# Patient Record
Sex: Male | Born: 1935 | Race: Black or African American | Hispanic: No | Smoking: Never smoker
Health system: Southern US, Community
[De-identification: ages and names within clinical notes are randomized; demographics above are authoritative.]

## PROBLEM LIST (undated history)

## (undated) DIAGNOSIS — M109 Gout, unspecified: Secondary | ICD-10-CM

## (undated) DIAGNOSIS — K219 Gastro-esophageal reflux disease without esophagitis: Secondary | ICD-10-CM

## (undated) DIAGNOSIS — E119 Type 2 diabetes mellitus without complications: Secondary | ICD-10-CM

## (undated) DIAGNOSIS — E785 Hyperlipidemia, unspecified: Secondary | ICD-10-CM

## (undated) DIAGNOSIS — C61 Malignant neoplasm of prostate: Secondary | ICD-10-CM

## (undated) DIAGNOSIS — I10 Essential (primary) hypertension: Secondary | ICD-10-CM

## (undated) DIAGNOSIS — I519 Heart disease, unspecified: Secondary | ICD-10-CM

## (undated) HISTORY — PX: JOINT REPLACEMENT: SHX530

## (undated) HISTORY — DX: Essential (primary) hypertension: I10

## (undated) HISTORY — DX: Malignant neoplasm of prostate: C61

## (undated) HISTORY — DX: Heart disease, unspecified: I51.9

## (undated) HISTORY — DX: Gout, unspecified: M10.9

## (undated) HISTORY — DX: Type 2 diabetes mellitus without complications: E11.9

## (undated) HISTORY — DX: Gastro-esophageal reflux disease without esophagitis: K21.9

## (undated) HISTORY — DX: Hyperlipidemia, unspecified: E78.5

## (undated) HISTORY — PX: CARDIAC SURGERY: SHX584

---

## 2003-12-30 ENCOUNTER — Other Ambulatory Visit: Payer: Self-pay

## 2004-01-24 ENCOUNTER — Encounter: Payer: Self-pay | Admitting: Family Medicine

## 2004-02-24 ENCOUNTER — Encounter: Payer: Self-pay | Admitting: Family Medicine

## 2004-03-25 ENCOUNTER — Encounter: Payer: Self-pay | Admitting: Family Medicine

## 2006-01-30 ENCOUNTER — Ambulatory Visit: Payer: Self-pay | Admitting: Urology

## 2006-02-08 ENCOUNTER — Ambulatory Visit: Payer: Self-pay | Admitting: Radiation Oncology

## 2006-02-28 ENCOUNTER — Ambulatory Visit: Payer: Self-pay | Admitting: Radiation Oncology

## 2006-03-25 ENCOUNTER — Ambulatory Visit: Payer: Self-pay | Admitting: Radiation Oncology

## 2006-04-25 ENCOUNTER — Ambulatory Visit: Payer: Self-pay | Admitting: Radiation Oncology

## 2006-05-26 ENCOUNTER — Ambulatory Visit: Payer: Self-pay | Admitting: Radiation Oncology

## 2006-07-13 ENCOUNTER — Ambulatory Visit: Payer: Self-pay | Admitting: Radiation Oncology

## 2006-09-20 ENCOUNTER — Ambulatory Visit: Payer: Self-pay | Admitting: Family Medicine

## 2006-09-21 ENCOUNTER — Ambulatory Visit: Payer: Self-pay | Admitting: Radiation Oncology

## 2006-09-24 ENCOUNTER — Ambulatory Visit: Payer: Self-pay | Admitting: Radiation Oncology

## 2006-10-10 ENCOUNTER — Ambulatory Visit: Payer: Self-pay | Admitting: Unknown Physician Specialty

## 2006-10-24 ENCOUNTER — Ambulatory Visit: Payer: Self-pay | Admitting: Radiation Oncology

## 2007-03-26 ENCOUNTER — Ambulatory Visit: Payer: Self-pay | Admitting: Radiation Oncology

## 2007-04-26 ENCOUNTER — Ambulatory Visit: Payer: Self-pay | Admitting: Radiation Oncology

## 2007-06-03 ENCOUNTER — Emergency Department: Payer: Self-pay | Admitting: Emergency Medicine

## 2007-06-24 ENCOUNTER — Ambulatory Visit: Payer: Self-pay | Admitting: Radiation Oncology

## 2007-09-24 ENCOUNTER — Ambulatory Visit: Payer: Self-pay | Admitting: Radiation Oncology

## 2008-05-26 ENCOUNTER — Ambulatory Visit: Payer: Self-pay | Admitting: Radiation Oncology

## 2008-06-19 ENCOUNTER — Ambulatory Visit: Payer: Self-pay | Admitting: Radiation Oncology

## 2008-06-23 ENCOUNTER — Ambulatory Visit: Payer: Self-pay | Admitting: Radiation Oncology

## 2011-05-30 ENCOUNTER — Ambulatory Visit: Payer: Self-pay | Admitting: Gastroenterology

## 2012-02-08 DIAGNOSIS — N529 Male erectile dysfunction, unspecified: Secondary | ICD-10-CM | POA: Insufficient documentation

## 2012-02-08 DIAGNOSIS — C61 Malignant neoplasm of prostate: Secondary | ICD-10-CM | POA: Insufficient documentation

## 2012-03-13 ENCOUNTER — Ambulatory Visit: Payer: Self-pay | Admitting: Urology

## 2012-10-03 DIAGNOSIS — N3941 Urge incontinence: Secondary | ICD-10-CM | POA: Insufficient documentation

## 2012-10-03 DIAGNOSIS — R350 Frequency of micturition: Secondary | ICD-10-CM | POA: Insufficient documentation

## 2013-09-19 DIAGNOSIS — K219 Gastro-esophageal reflux disease without esophagitis: Secondary | ICD-10-CM | POA: Insufficient documentation

## 2013-09-19 DIAGNOSIS — E785 Hyperlipidemia, unspecified: Secondary | ICD-10-CM | POA: Insufficient documentation

## 2013-09-19 DIAGNOSIS — I739 Peripheral vascular disease, unspecified: Secondary | ICD-10-CM | POA: Insufficient documentation

## 2013-09-19 DIAGNOSIS — I428 Other cardiomyopathies: Secondary | ICD-10-CM | POA: Insufficient documentation

## 2013-09-19 DIAGNOSIS — I25810 Atherosclerosis of coronary artery bypass graft(s) without angina pectoris: Secondary | ICD-10-CM | POA: Insufficient documentation

## 2013-09-19 DIAGNOSIS — I429 Cardiomyopathy, unspecified: Secondary | ICD-10-CM | POA: Insufficient documentation

## 2013-10-08 ENCOUNTER — Emergency Department: Payer: Self-pay | Admitting: Emergency Medicine

## 2013-10-15 ENCOUNTER — Ambulatory Visit: Payer: Self-pay | Admitting: Emergency Medicine

## 2013-10-22 ENCOUNTER — Encounter: Payer: Self-pay | Admitting: Emergency Medicine

## 2013-10-23 ENCOUNTER — Encounter: Payer: Self-pay | Admitting: Emergency Medicine

## 2013-11-04 DIAGNOSIS — I10 Essential (primary) hypertension: Secondary | ICD-10-CM | POA: Insufficient documentation

## 2013-11-04 DIAGNOSIS — E119 Type 2 diabetes mellitus without complications: Secondary | ICD-10-CM | POA: Insufficient documentation

## 2013-11-23 ENCOUNTER — Encounter: Payer: Self-pay | Admitting: Emergency Medicine

## 2014-04-30 DIAGNOSIS — E119 Type 2 diabetes mellitus without complications: Secondary | ICD-10-CM | POA: Diagnosis not present

## 2014-04-30 DIAGNOSIS — M79671 Pain in right foot: Secondary | ICD-10-CM | POA: Diagnosis not present

## 2014-04-30 DIAGNOSIS — B351 Tinea unguium: Secondary | ICD-10-CM | POA: Diagnosis not present

## 2014-04-30 DIAGNOSIS — M79672 Pain in left foot: Secondary | ICD-10-CM | POA: Diagnosis not present

## 2014-08-27 DIAGNOSIS — H5210 Myopia, unspecified eye: Secondary | ICD-10-CM | POA: Diagnosis not present

## 2014-08-27 DIAGNOSIS — E11319 Type 2 diabetes mellitus with unspecified diabetic retinopathy without macular edema: Secondary | ICD-10-CM | POA: Diagnosis not present

## 2014-08-27 DIAGNOSIS — H524 Presbyopia: Secondary | ICD-10-CM | POA: Diagnosis not present

## 2014-09-25 DIAGNOSIS — R6889 Other general symptoms and signs: Secondary | ICD-10-CM | POA: Diagnosis not present

## 2014-10-23 DIAGNOSIS — K219 Gastro-esophageal reflux disease without esophagitis: Secondary | ICD-10-CM | POA: Diagnosis not present

## 2014-10-23 DIAGNOSIS — C61 Malignant neoplasm of prostate: Secondary | ICD-10-CM | POA: Diagnosis not present

## 2014-10-23 DIAGNOSIS — E119 Type 2 diabetes mellitus without complications: Secondary | ICD-10-CM | POA: Diagnosis not present

## 2014-10-23 DIAGNOSIS — I25810 Atherosclerosis of coronary artery bypass graft(s) without angina pectoris: Secondary | ICD-10-CM | POA: Diagnosis not present

## 2014-10-23 DIAGNOSIS — E780 Pure hypercholesterolemia: Secondary | ICD-10-CM | POA: Diagnosis not present

## 2014-11-21 ENCOUNTER — Encounter: Payer: Self-pay | Admitting: Urology

## 2014-11-21 ENCOUNTER — Ambulatory Visit: Payer: Self-pay | Admitting: Urology

## 2014-11-21 ENCOUNTER — Ambulatory Visit (INDEPENDENT_AMBULATORY_CARE_PROVIDER_SITE_OTHER): Payer: Commercial Managed Care - HMO | Admitting: Urology

## 2014-11-21 VITALS — BP 134/74 | HR 64 | Ht 68.0 in | Wt 179.2 lb

## 2014-11-21 DIAGNOSIS — C61 Malignant neoplasm of prostate: Secondary | ICD-10-CM

## 2014-11-21 DIAGNOSIS — N3941 Urge incontinence: Secondary | ICD-10-CM

## 2014-11-21 DIAGNOSIS — I10 Essential (primary) hypertension: Secondary | ICD-10-CM | POA: Insufficient documentation

## 2014-11-21 LAB — URINALYSIS, COMPLETE
BILIRUBIN UA: NEGATIVE
GLUCOSE, UA: NEGATIVE
KETONES UA: NEGATIVE
LEUKOCYTES UA: NEGATIVE
Nitrite, UA: NEGATIVE
PH UA: 5.5 (ref 5.0–7.5)
RBC, UA: NEGATIVE
Specific Gravity, UA: >1.03 — ABNORMAL HIGH (ref 1.005–1.030)
UUROB: 0.2 mg/dL (ref 0.2–1.0)

## 2014-11-21 LAB — MICROSCOPIC EXAMINATION
Bacteria, UA: NONE SEEN
RBC, UA: NONE SEEN /hpf (ref 0–?)
RENAL EPITHEL UA: NONE SEEN /HPF

## 2014-11-21 LAB — BLADDER SCAN AMB NON-IMAGING: Scan Result: 52

## 2014-11-21 NOTE — Progress Notes (Signed)
11/21/2014 10:03 AM   Justin Greene 10-Jun-1934 [id number]  Referring provider: Sherrin Daisy, MD [place], [place] [address]  Chief Complaint  Patient presents with  . Prostate Cancer    establishing care, history of prostate cancer     HPI: 80 yo M who presents today to establisollow-up of prostate cancer.  He is a former patient of [facility] urology would prefer to transfer his care here to [place].   He was diagnosed with clinical stage T1c, Gleason 3+4 adenocarcinoma the prostate in September 2007 and completed IMRT in 2008. He does not recall ever receiving ADT.  PSA last year was stable at 0.9 (10/2013) up slightly from the previous year 0.7. He has no bothersome lower urinary tract symptoms.  He has rare episodes of urgency with urge incontinence which is somewhat bothersome.  He only gets up once to urinate.  His urinary stream is excellent and does feel as though he is able to empty his bladder.  No history of UTIs.  He denies dysuria or gross hematuria. No flank pain. No history of nephrolithiasis.  He does complain today of left inner tight burning.   PMH: Past Medical History  Diagnosis Date  . Prostate cancer   . Diabetes mellitus without complication   . Hypertension   . Acid reflux   . Heart disease   . Gout   . Heart disease   . Hyperlipidemia   . Hypertension     Surgical History: Past Surgical History  Procedure Laterality Date  . Cardiac surgery      Home Medications:    Medication List       This list is accurate as of: 11/21/14 11:59 PM.  Always use your most recent med list.               acetaminophen 325 MG tablet  Commonly known as:  TYLENOL  Take 650 mg by mouth.     amLODipine 5 MG tablet  Commonly known as:  NORVASC  Take 5 mg by mouth.     aspirin EC 81 MG tablet  Take 81 mg by mouth.     atenolol 25 MG tablet  Commonly known as:  TENORMIN  Take 25 mg by mouth.     BESIVANCE 0.6 % Susp  Generic  drug:  Besifloxacin HCl     cyclobenzaprine 10 MG tablet  Commonly known as:  FLEXERIL     DUREZOL 0.05 % Emul  Generic drug:  Difluprednate     glimepiride 2 MG tablet  Commonly known as:  AMARYL  Take 2 mg by mouth.     ibuprofen 800 MG tablet  Commonly known as:  ADVIL,MOTRIN     LEVITRA 10 MG tablet  Generic drug:  vardenafil  Take 10 mg by mouth.     LIPITOR 40 MG tablet  Generic drug:  atorvastatin  Take 40 mg by mouth.     meloxicam 15 MG tablet  Commonly known as:  MOBIC  TK 1 T PO QD     metFORMIN 500 MG tablet  Commonly known as:  GLUCOPHAGE     pantoprazole 40 MG tablet  Commonly known as:  PROTONIX     PLAVIX 75 MG tablet  Generic drug:  clopidogrel  Take 75 mg by mouth.     rOPINIRole 0.25 MG tablet  Commonly known as:  REQUIP  Take 0.25 mg by mouth.     triamcinolone 0.025 % cream  Commonly known  as:  KENALOG  APP EXT AA BID        Allergies:  Allergies  Allergen Reactions  . Sulfa Antibiotics Rash    Family History: Family History  Problem Relation Age of Onset  . Kidney failure Sister   . Prostate cancer Father   . Diabetes Sister   . Ulcers Father   . Ulcers Sister     Social History:  reports that he has never smoked. He does not have any smokeless tobacco history on file. He reports that he does not drink alcohol or use illicit drugs.  ROS: UROLOGY Frequent Urination?: No Hard to postpone urination?: No Burning/pain with urination?: No Get up at night to urinate?: Yes Leakage of urine?: Yes Urine stream starts and stops?: No Trouble starting stream?: No Do you have to strain to urinate?: No Blood in urine?: No Urinary tract infection?: No Sexually transmitted disease?: No Injury to kidneys or bladder?: No Painful intercourse?: No Weak stream?: No Erection problems?: No Penile pain?: No  Gastrointestinal Nausea?: No Vomiting?: No Indigestion/heartburn?: No Diarrhea?: No Constipation?:  No  Constitutional Fever: No Night sweats?: No Weight loss?: No Fatigue?: No  Skin Skin rash/lesions?: No Itching?: No  Eyes Blurred vision?: No Double vision?: No  Ears/Nose/Throat Sore throat?: Yes Sinus problems?: Yes  Hematologic/Lymphatic Swollen glands?: No Easy bruising?: No  Cardiovascular Leg swelling?: Yes Chest pain?: No  Respiratory Cough?: No Shortness of breath?: No  Endocrine Excessive thirst?: Yes  Musculoskeletal Back pain?: Yes Joint pain?: Yes  Neurological Headaches?: Yes Dizziness?: No  Psychologic Depression?: No Anxiety?: No  Physical Exam: BP 134/74 mmHg  Pulse 64  Ht 5' 8"  (1.727 m)  Wt 179 lb 3.2 oz (81.285 kg)  BMI 27.25 kg/m2  Constitutional:  Alert and oriented, No acute distress. HEENT: [place] AT, moist mucus membranes.  Trachea midline, no masses. Cardiovascular: No clubbing, cyanosis, or edema. Respiratory: Normal respiratory effort, no increased work of breathing. GI: Abdomen is soft, nontender, nondistended, no abdominal masses GU: No CVA tenderness. Uncircumcised phallus with easily retractible foreskin.  Normal scrotum with bilateral descended testicles, no masses, nontender. Rectal exam today with normal sphincter tone, 30+ gram prostate, diffusely firm, no nodules, nontender. Skin: No rashes, bruises or suspicious lesions. Lymph: No cervical or inguinal adenopathy. Neurologic: Grossly intact, no focal deficits, moving all 4 extremities. Psychiatric: Normal mood and affect.  Laboratory Data: Hemoglobin A1C - Final result (10/23/2014 9:01 AM) Hemoglobin A1C - Final result (10/23/2014 9:01 AM)  Component Value Range  Hemoglobin A1C 6.8 (H) 4.2-5.6 %    Basic Metabolic Panel (BMP) - Final result (10/23/2014 9:01 AM) Basic Metabolic Panel (BMP) - Final result (10/23/2014 9:01 AM)  Component Value Range  Glucose 150 (H) 70-110 mg/dL  Sodium 138 136-145 mmol/L  Potassium 4.4 3.6-5.1 mmol/L  Chloride 103 97-109  mmol/L  Carbon Dioxide (CO2) 32.0 22.0-32.0 mmol/L  Calcium 9.4 8.7-10.3 mg/dL  Urea Nitrogen (BUN) 14 7-25 mg/dL  Creatinine 1.0 0.7-1.3 mg/dL  Glomerular Filtration Rate (eGFR), MDRD Estimate 88 >60 mL/min/1.73sq m  BUN/Crea Ratio 14.0 6.0-20.0  Anion Gap w/K 7.4 6.0-16.0      Urinalysis Results for orders placed or performed in visit on 11/21/14  Microscopic Examination  Result Value Ref Range   WBC, UA 0-5 0 -  5 /hpf   RBC, UA None seen 0 -  2 /hpf   Epithelial Cells (non renal) 0-10 0 - 10 /hpf   Renal Epithel, UA None seen None seen /hpf   Mucus, UA  Present (A) Not Estab.   Bacteria, UA None seen None seen/Few  PSA  Result Value Ref Range   Prostate Specific Ag, Serum 1.4 0.0 - 4.0 ng/mL  Urinalysis, Complete  Result Value Ref Range   Specific Gravity, UA >1.030 (H) 1.005 - 1.030   pH, UA 5.5 5.0 - 7.5   Color, UA Yellow Yellow   Appearance Ur Clear Clear   Leukocytes, UA Negative Negative   Protein, UA 1+ (A) Negative/Trace   Glucose, UA Negative Negative   Ketones, UA Negative Negative   RBC, UA Negative Negative   Bilirubin, UA Negative Negative   Urobilinogen, Ur 0.2 0.2 - 1.0 mg/dL   Nitrite, UA Negative Negative   Microscopic Examination See below:   Bladder Scan (Post Void Residual) in office  Result Value Ref Range   Scan Result 52     Pertinent Imaging: n/a  Assessment & Plan:  80 year old male with remote history of Gleason 3+4 prostate cancer status post EBRT in 2008.  He also has episodes of urge incontinence.  1. Prostate cancer PSA up slightly today to 1.4 from 0.9 one year ago. Generally, would recommend annual PSA, however, given recent PSA acceleration, will recheck in 6 months. - PSA  2. Urge incontinence of urine We discussed his symptoms today in detail. Behavioral modification and avoidance of irritating beverages were discussed. We also discussed potential use of an anticholinergic medication, however, after discussing possible side  effects, he would prefer to defer any other additional medications. He is not significantly bothered by this.  No evidence of urinary retention or infection. - Urinalysis, Complete - Bladder Scan (Post Void Residual) in office   Return in about 6 months (around 05/24/2015) for PSA.  [name], MD  [facility] [address] [place], [place] [address] [phone]

## 2014-11-22 LAB — PSA: Prostate Specific Ag, Serum: 1.4 ng/mL (ref 0.0–4.0)

## 2014-11-23 ENCOUNTER — Encounter: Payer: Self-pay | Admitting: Urology

## 2014-11-24 ENCOUNTER — Telehealth: Payer: Self-pay | Admitting: *Deleted

## 2014-11-25 NOTE — Telephone Encounter (Signed)
Opened in error. . . sm

## 2015-04-16 DIAGNOSIS — R0982 Postnasal drip: Secondary | ICD-10-CM | POA: Diagnosis not present

## 2015-04-29 DIAGNOSIS — E1142 Type 2 diabetes mellitus with diabetic polyneuropathy: Secondary | ICD-10-CM | POA: Diagnosis not present

## 2015-04-29 DIAGNOSIS — K219 Gastro-esophageal reflux disease without esophagitis: Secondary | ICD-10-CM | POA: Diagnosis not present

## 2015-04-29 DIAGNOSIS — I10 Essential (primary) hypertension: Secondary | ICD-10-CM | POA: Diagnosis not present

## 2015-04-29 DIAGNOSIS — E7800 Pure hypercholesterolemia, unspecified: Secondary | ICD-10-CM | POA: Diagnosis not present

## 2015-06-03 ENCOUNTER — Other Ambulatory Visit: Payer: Self-pay | Admitting: *Deleted

## 2015-06-03 NOTE — Patient Outreach (Signed)
[facility]) Care Management  06/03/2015  Justin Greene 27-Apr-1934 [id number]  Subjective:  Telephone call from patient, HIPAA verified.   Patient states he is returning a call from [facility] phone number that he had received on his mobile phone.   RNCM reviewed Epic and no documentation of any attempt calls from [facility].   RNCM advised patient RNCM would research and someone would call patient back if needed for patient outreach.  RNCM spoke with Damita Rhodie at [facility].   States patient is eligible for program due to payer status, no calls,  or mailings have went out to patient from tier list referral process.  Objective: Epic case review.  No [facility] Care Management referral triggers noted.  Plan:  No [facility] Care Management services needs noted at this time.    Lorry Anastasi H. Annia Friendly, BSN, [place] [facility] Telephonic CM Phone: [phone] Fax: [phone]

## 2015-06-26 DIAGNOSIS — M1611 Unilateral primary osteoarthritis, right hip: Secondary | ICD-10-CM | POA: Diagnosis not present

## 2015-07-01 DIAGNOSIS — M1611 Unilateral primary osteoarthritis, right hip: Secondary | ICD-10-CM | POA: Insufficient documentation

## 2015-09-03 DIAGNOSIS — E113393 Type 2 diabetes mellitus with moderate nonproliferative diabetic retinopathy without macular edema, bilateral: Secondary | ICD-10-CM | POA: Diagnosis not present

## 2015-09-03 DIAGNOSIS — H524 Presbyopia: Secondary | ICD-10-CM | POA: Diagnosis not present

## 2015-09-03 DIAGNOSIS — H5210 Myopia, unspecified eye: Secondary | ICD-10-CM | POA: Diagnosis not present

## 2015-11-02 DIAGNOSIS — E119 Type 2 diabetes mellitus without complications: Secondary | ICD-10-CM | POA: Diagnosis not present

## 2015-11-02 DIAGNOSIS — I10 Essential (primary) hypertension: Secondary | ICD-10-CM | POA: Diagnosis not present

## 2015-11-02 DIAGNOSIS — E7800 Pure hypercholesterolemia, unspecified: Secondary | ICD-10-CM | POA: Diagnosis not present

## 2015-11-02 DIAGNOSIS — M79605 Pain in left leg: Secondary | ICD-10-CM | POA: Diagnosis not present

## 2015-11-18 DIAGNOSIS — M4806 Spinal stenosis, lumbar region: Secondary | ICD-10-CM | POA: Diagnosis not present

## 2015-11-18 DIAGNOSIS — M5416 Radiculopathy, lumbar region: Secondary | ICD-10-CM | POA: Diagnosis not present

## 2015-11-18 DIAGNOSIS — M1611 Unilateral primary osteoarthritis, right hip: Secondary | ICD-10-CM | POA: Diagnosis not present

## 2015-11-20 ENCOUNTER — Ambulatory Visit (INDEPENDENT_AMBULATORY_CARE_PROVIDER_SITE_OTHER): Payer: Commercial Managed Care - HMO | Admitting: Urology

## 2015-11-20 ENCOUNTER — Encounter: Payer: Self-pay | Admitting: Urology

## 2015-11-20 VITALS — BP 145/75 | HR 66 | Ht 68.0 in | Wt 175.3 lb

## 2015-11-20 DIAGNOSIS — N3941 Urge incontinence: Secondary | ICD-10-CM | POA: Diagnosis not present

## 2015-11-20 DIAGNOSIS — C61 Malignant neoplasm of prostate: Secondary | ICD-10-CM

## 2015-11-20 NOTE — Progress Notes (Signed)
11/20/2015 9:21 AM   [name] 07-06-1934 [id number]  Referring provider: Sherrin Daisy, MD [place], [place] [id number]  Chief Complaint  Patient presents with  . Follow-up    HPI: 81 yo M wit history of prostate cancer who returns for annual follow up.     He was diagnosed with clinical stage T1c, Gleason 3+4 adenocarcinoma the prostate in September 2007 and completed IMRT in 2008. He does not recall ever receiving ADT.  Recent PSA trend: 0.7  2014 0.9  10/2013 1.4  10/2014  His urinary symptoms are stable.  He continues to have rare episodes of urgency with urge incontinence which is somewhat bothersome.  He only gets up once to urinate.  His urinary stream is excellent and does feel as though he is able to empty his bladder.  No history of UTIs.   He does complain today of pains in his lower back has been going on for a while.  He is also having pain in his leg which his primary care believes is related to his hip.    PMH: Past Medical History:  Diagnosis Date  . Acid reflux   . Diabetes mellitus without complication ([place])   . Gout   . Heart disease   . Heart disease   . Hyperlipidemia   . Hypertension   . Hypertension   . Prostate cancer [facility])     Surgical History: Past Surgical History:  Procedure Laterality Date  . CARDIAC SURGERY      Home Medications:    Medication List       Accurate as of 11/20/15  9:21 AM. Always use your most recent med list.          acetaminophen 325 MG tablet Commonly known as:  TYLENOL Take 650 mg by mouth.   amLODipine 5 MG tablet Commonly known as:  NORVASC Take 5 mg by mouth.   aspirin EC 81 MG tablet Take 81 mg by mouth.   atenolol 25 MG tablet Commonly known as:  TENORMIN Take 25 mg by mouth.   BESIVANCE 0.6 % Susp Generic drug:  Besifloxacin HCl   cyclobenzaprine 10 MG tablet Commonly known as:  FLEXERIL   DUREZOL 0.05 % Emul Generic drug:  Difluprednate   glimepiride 2 MG  tablet Commonly known as:  AMARYL Take 2 mg by mouth.   ibuprofen 800 MG tablet Commonly known as:  ADVIL,MOTRIN   LEVITRA 10 MG tablet Generic drug:  vardenafil Take 10 mg by mouth.   LIPITOR 40 MG tablet Generic drug:  atorvastatin Take 40 mg by mouth.   meloxicam 15 MG tablet Commonly known as:  MOBIC TK 1 T PO QD   metFORMIN 500 MG tablet Commonly known as:  GLUCOPHAGE   pantoprazole 40 MG tablet Commonly known as:  PROTONIX   PLAVIX 75 MG tablet Generic drug:  clopidogrel Take 75 mg by mouth.   rOPINIRole 0.25 MG tablet Commonly known as:  REQUIP Take 0.25 mg by mouth.   triamcinolone 0.025 % cream Commonly known as:  KENALOG APP EXT AA BID       Allergies:  Allergies  Allergen Reactions  . Sulfa Antibiotics Rash    Family History: Family History  Problem Relation Age of Onset  . Kidney failure Sister   . Prostate cancer Father   . Diabetes Sister   . Ulcers Father   . Ulcers Sister     Social History:  reports that he has never smoked.  He has never used smokeless tobacco. He reports that he does not drink alcohol or use drugs.  ROS: UROLOGY Frequent Urination?: No Hard to postpone urination?: No Burning/pain with urination?: No Get up at night to urinate?: Yes Leakage of urine?: Yes Urine stream starts and stops?: No Trouble starting stream?: No Do you have to strain to urinate?: No Blood in urine?: No Urinary tract infection?: No Sexually transmitted disease?: No Injury to kidneys or bladder?: No Painful intercourse?: No Weak stream?: No Erection problems?: No Penile pain?: No  Gastrointestinal Nausea?: No Vomiting?: No Indigestion/heartburn?: No Diarrhea?: No Constipation?: No  Constitutional Fever: No Night sweats?: No Weight loss?: No Fatigue?: No  Skin Skin rash/lesions?: No Itching?: No  Eyes Blurred vision?: No Double vision?: No  Ears/Nose/Throat Sore throat?: No Sinus problems?:  No  Hematologic/Lymphatic Swollen glands?: No Easy bruising?: No  Cardiovascular Leg swelling?: No Chest pain?: No  Respiratory Cough?: No Shortness of breath?: No  Endocrine Excessive thirst?: No  Musculoskeletal Back pain?: Yes Joint pain?: Yes  Neurological Headaches?: No Dizziness?: No  Psychologic Depression?: No Anxiety?: No  Physical Exam: BP (!) 145/75 (BP Location: Left Arm, Patient Position: Sitting, Cuff Size: Normal)   Pulse 66   Ht 5\' 8"  (1.727 m)   Wt 175 lb 4.8 oz (79.5 kg)   BMI 26.65 kg/m   Constitutional:  Alert and oriented, No acute distress. HEENT: [place] AT, moist mucus membranes.  Trachea midline, no masses. Cardiovascular: No clubbing, cyanosis, or edema. Respiratory: Normal respiratory effort, no increased work of breathing. GI: Abdomen is soft, nontender, nondistended, no abdominal masses GU: No CVA tenderness. Uncircumcised phallus with easily retractible foreskin.  Normal scrotum with bilateral descended testicles, no masses, nontender. Rectal exam today with normal sphincter tone, 30+ gram prostate, diffusely firm, no nodules, nontender. Skin: No rashes, bruises or suspicious lesions. Lymph: No cervical or inguinal adenopathy. Neurologic: Grossly intact, no focal deficits, moving all 4 extremities. Psychiatric: Normal mood and affect.  Laboratory Data: CREATININE 1.0 04/29/2015 Hemoglobin A1C 6.7 (H) 4.2 - 5.6 %   PSA trend as above  Pertinent Imaging: n/a  Assessment & Plan:  81 year old male with remote history of Gleason 3+4 prostate cancer status post EBRT in 2008.  He also has episodes of urge incontinence.  1. Prostate cancer PSA rising from 1.4 from 0.9 last year.  PSA repeated today.   If stable, will continue to follow annually If rising, may consider restaging imaging due to concern for biochemical recurrence  - PSA  2. Urge incontinence of urine Minimal bother.  Not interested in   Return in about 1 year (around  11/19/2016) for PSA/ DRE.  [name], MD  [facility] [address] [place],  [address] [phone]

## 2015-11-21 LAB — PSA: Prostate Specific Ag, Serum: 1.5 ng/mL (ref 0.0–4.0)

## 2015-11-23 ENCOUNTER — Telehealth: Payer: Self-pay | Admitting: *Deleted

## 2015-11-23 NOTE — Telephone Encounter (Signed)
LMOM for patient to call office back. PSA stable from last year. We will continue to follow up as scheduled.

## 2015-11-23 NOTE — Telephone Encounter (Signed)
-----   Message from [name], MD sent at 11/22/2015  1:37 PM EDT ----- PSA stable from last year.  We will continue to follow as scheduled.    [name], MD

## 2015-11-24 NOTE — Telephone Encounter (Signed)
LMOM to RC 

## 2015-11-25 ENCOUNTER — Telehealth: Payer: Self-pay | Admitting: Urology

## 2015-11-25 NOTE — Telephone Encounter (Signed)
LMOM that labs are stable from last year and we will continue to follow as scheduled if any questions call office back.

## 2015-11-25 NOTE — Telephone Encounter (Signed)
Spoke with patient and gave him the results, he had no questions   michelle

## 2015-11-25 NOTE — Telephone Encounter (Signed)
LMOM for patient to call office back. 

## 2015-12-01 DIAGNOSIS — M1611 Unilateral primary osteoarthritis, right hip: Secondary | ICD-10-CM | POA: Diagnosis not present

## 2016-02-23 ENCOUNTER — Other Ambulatory Visit: Payer: Self-pay | Admitting: Physical Medicine and Rehabilitation

## 2016-02-23 DIAGNOSIS — M5416 Radiculopathy, lumbar region: Secondary | ICD-10-CM | POA: Diagnosis not present

## 2016-02-23 DIAGNOSIS — M1611 Unilateral primary osteoarthritis, right hip: Secondary | ICD-10-CM | POA: Diagnosis not present

## 2016-02-23 DIAGNOSIS — M48062 Spinal stenosis, lumbar region with neurogenic claudication: Secondary | ICD-10-CM | POA: Diagnosis not present

## 2016-03-07 DIAGNOSIS — M1611 Unilateral primary osteoarthritis, right hip: Secondary | ICD-10-CM | POA: Diagnosis not present

## 2016-03-09 ENCOUNTER — Encounter (INDEPENDENT_AMBULATORY_CARE_PROVIDER_SITE_OTHER): Payer: Self-pay

## 2016-03-09 ENCOUNTER — Ambulatory Visit
Admission: RE | Admit: 2016-03-09 | Discharge: 2016-03-09 | Disposition: A | Discharge status: Home / Self Care | Payer: Commercial Managed Care - HMO | Source: Ambulatory Visit | Attending: Physical Medicine and Rehabilitation | Admitting: Physical Medicine and Rehabilitation

## 2016-03-09 DIAGNOSIS — M48061 Spinal stenosis, lumbar region without neurogenic claudication: Secondary | ICD-10-CM | POA: Diagnosis not present

## 2016-03-09 DIAGNOSIS — M545 Low back pain: Secondary | ICD-10-CM | POA: Diagnosis not present

## 2016-03-09 DIAGNOSIS — M5416 Radiculopathy, lumbar region: Secondary | ICD-10-CM | POA: Diagnosis not present

## 2016-03-22 DIAGNOSIS — E119 Type 2 diabetes mellitus without complications: Secondary | ICD-10-CM | POA: Diagnosis not present

## 2016-03-31 ENCOUNTER — Other Ambulatory Visit (INDEPENDENT_AMBULATORY_CARE_PROVIDER_SITE_OTHER): Payer: Self-pay | Admitting: Vascular Surgery

## 2016-04-04 ENCOUNTER — Other Ambulatory Visit (INDEPENDENT_AMBULATORY_CARE_PROVIDER_SITE_OTHER): Payer: Self-pay | Admitting: Vascular Surgery

## 2016-04-04 DIAGNOSIS — M48062 Spinal stenosis, lumbar region with neurogenic claudication: Secondary | ICD-10-CM | POA: Diagnosis not present

## 2016-04-04 DIAGNOSIS — M1611 Unilateral primary osteoarthritis, right hip: Secondary | ICD-10-CM | POA: Diagnosis not present

## 2016-04-04 DIAGNOSIS — M5416 Radiculopathy, lumbar region: Secondary | ICD-10-CM | POA: Diagnosis not present

## 2016-04-28 DIAGNOSIS — K006 Disturbances in tooth eruption: Secondary | ICD-10-CM | POA: Diagnosis not present

## 2016-05-05 DIAGNOSIS — I739 Peripheral vascular disease, unspecified: Secondary | ICD-10-CM | POA: Diagnosis not present

## 2016-05-05 DIAGNOSIS — B351 Tinea unguium: Secondary | ICD-10-CM | POA: Diagnosis not present

## 2016-05-05 DIAGNOSIS — E1142 Type 2 diabetes mellitus with diabetic polyneuropathy: Secondary | ICD-10-CM | POA: Diagnosis not present

## 2016-05-05 DIAGNOSIS — I10 Essential (primary) hypertension: Secondary | ICD-10-CM | POA: Diagnosis not present

## 2016-05-05 DIAGNOSIS — J209 Acute bronchitis, unspecified: Secondary | ICD-10-CM | POA: Diagnosis not present

## 2016-05-05 DIAGNOSIS — E1169 Type 2 diabetes mellitus with other specified complication: Secondary | ICD-10-CM | POA: Diagnosis not present

## 2016-05-05 DIAGNOSIS — I429 Cardiomyopathy, unspecified: Secondary | ICD-10-CM | POA: Diagnosis not present

## 2016-05-05 DIAGNOSIS — E7800 Pure hypercholesterolemia, unspecified: Secondary | ICD-10-CM | POA: Diagnosis not present

## 2016-05-09 DIAGNOSIS — I739 Peripheral vascular disease, unspecified: Secondary | ICD-10-CM | POA: Diagnosis not present

## 2016-05-09 DIAGNOSIS — L300 Nummular dermatitis: Secondary | ICD-10-CM | POA: Diagnosis not present

## 2016-08-01 ENCOUNTER — Ambulatory Visit
Admission: EM | Admit: 2016-08-01 | Discharge: 2016-08-01 | Disposition: A | Discharge status: Home / Self Care | Payer: Medicare HMO | Attending: Family Medicine | Admitting: Family Medicine

## 2016-08-01 ENCOUNTER — Encounter: Payer: Self-pay | Admitting: Emergency Medicine

## 2016-08-01 DIAGNOSIS — F5101 Primary insomnia: Secondary | ICD-10-CM

## 2016-08-01 MED ORDER — ZOLPIDEM TARTRATE 5 MG PO TABS: 5.0000 mg | ORAL_TABLET | Freq: Every evening | ORAL | 0 refills | Status: DC | PRN

## 2016-08-01 NOTE — ED Provider Notes (Signed)
[facility]    CSN: [id number] Arrival date & time: 08/01/16  0909     History   Chief Complaint Chief Complaint  Patient presents with  . Insomnia    HPI Justin Greene is a 82 y.o. male.   The history is provided by the patient.  Insomnia  This is a new problem. The current episode started more than 2 days ago (3 days ago). The problem occurs constantly. The problem has not changed since onset.Pertinent negatives include no chest pain, no abdominal pain, no headaches and no shortness of breath. Nothing relieves the symptoms.    Past Medical History:  Diagnosis Date  . Acid reflux   . Diabetes mellitus without complication ([place])   . Gout   . Heart disease   . Heart disease   . Hyperlipidemia   . Hypertension   . Hypertension   . Prostate cancer [facility])     Patient Active Problem List   Diagnosis Date Noted  . Primary osteoarthritis of right hip 07/01/2015  . BP (high blood pressure) 11/21/2014  . Diabetes mellitus type 2, uncomplicated ([place]) 85/46/2703  . Essential (primary) hypertension 11/04/2013  . 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 Cardiomyopathy ([place]) 09/19/2013  . Claudication ([place]) 09/19/2013  . Acid reflux 09/19/2013  . HLD (hyperlipidemia) 09/19/2013  . Peripheral vascular disease ([place]) 09/19/2013  . Cardiomyopathy, idiopathic ([place]) 09/19/2013  . Urge incontinence 10/03/2012  . FOM (frequency of micturition) 10/03/2012  . Erectile dysfunction 02/08/2012  . Prostate cancer ([place]) 02/08/2012    Past Surgical History:  Procedure Laterality Date  . CARDIAC SURGERY         Home Medications    Prior to Admission medications   Medication Sig Start Date End Date Taking? Authorizing Provider  lovastatin (MEVACOR) 20 MG tablet Take 20 mg by mouth daily at 6 PM.   Yes Historical Provider, MD  pregabalin (LYRICA) 150 MG capsule Take 150 mg by mouth 2 (two) times daily.   Yes Historical  Provider, MD  traMADol (ULTRAM) 50 MG tablet Take 75 mg by mouth every 12 (twelve) hours as needed.   Yes Historical Provider, MD  acetaminophen (TYLENOL) 325 MG tablet Take 650 mg by mouth.    Historical Provider, MD  amLODipine (NORVASC) 5 MG tablet Take 5 mg by mouth. 02/08/12   Historical Provider, MD  aspirin EC 81 MG tablet Take 81 mg by mouth. 02/08/12   Historical Provider, MD  atenolol (TENORMIN) 25 MG tablet Take 25 mg by mouth. 02/08/12   Historical Provider, MD  Besifloxacin HCl (BESIVANCE) 0.6 % SUSP  08/21/13   Historical Provider, MD  clopidogrel (PLAVIX) 75 MG tablet Take 75 mg by mouth. 02/08/12   Historical Provider, MD  Difluprednate (DUREZOL) 0.05 % EMUL  08/20/13   Historical Provider, MD  glimepiride (AMARYL) 2 MG tablet Take 2 mg by mouth. 02/08/12   Historical Provider, MD  ibuprofen (ADVIL,MOTRIN) 800 MG tablet TAKE 1 TABLET BY MOUTH EVERY 8 HOURS AS NEEDED FOR 7 DAYS 04/06/16   Algernon Huxley, MD  metFORMIN (GLUCOPHAGE) 500 MG tablet 500 mg 2 (two) times daily with a meal.  11/20/14   Historical Provider, MD  pantoprazole (PROTONIX) 40 MG tablet  07/24/12   Historical Provider, MD  triamcinolone (KENALOG) 0.025 % cream APP EXT AA BID 10/23/14   Historical Provider, MD  zolpidem (AMBIEN) 5 MG tablet Take 1 tablet (5 mg total) by mouth at bedtime as needed  for sleep. 08/01/16   Norval Gable, MD    Family History Family History  Problem Relation Age of Onset  . Kidney failure Sister   . Prostate cancer Father   . Ulcers Father   . Diabetes Sister   . Ulcers Sister     Social History Social History  Substance Use Topics  . Smoking status: Never Smoker  . Smokeless tobacco: Never Used  . Alcohol use No     Allergies   Sulfa antibiotics   Review of Systems Review of Systems  Respiratory: Negative for shortness of breath.   Cardiovascular: Negative for chest pain.  Gastrointestinal: Negative for abdominal pain.  Neurological: Negative for headaches.    Psychiatric/Behavioral: The patient has insomnia.      Physical Exam Triage Vital Signs ED Triage Vitals  Enc Vitals Group     BP 08/01/16 0931 (!) 158/74     Pulse Rate 08/01/16 0931 92     Resp 08/01/16 0931 16     Temp 08/01/16 0931 98.4 F (36.9 C)     Temp Source 08/01/16 0931 Oral     SpO2 08/01/16 0931 99 %     Weight 08/01/16 0930 175 lb (79.4 kg)     Height 08/01/16 0930 5\' 6"  (1.676 m)     Head Circumference --      Peak Flow --      Pain Score 08/01/16 0931 0     Pain Loc --      Pain Edu? --      Excl. in [place]? --    No data found.   Updated Vital Signs BP (!) 158/74 (BP Location: Left Arm)   Pulse 92   Temp 98.4 F (36.9 C) (Oral)   Resp 16   Ht 5\' 6"  (1.676 m)   Wt 175 lb (79.4 kg)   SpO2 99%   BMI 28.25 kg/m   Visual Acuity Right Eye Distance:   Left Eye Distance:   Bilateral Distance:    Right Eye Near:   Left Eye Near:    Bilateral Near:     Physical Exam  Constitutional: He is oriented to person, place, and time. He appears well-developed and well-nourished. No distress.  Cardiovascular: Normal rate, regular rhythm and normal heart sounds.   No murmur heard. Pulmonary/Chest: Effort normal and breath sounds normal. No respiratory distress. He has no wheezes. He has no rales.  Neurological: He is alert and oriented to person, place, and time. He displays normal reflexes. No cranial nerve deficit or sensory deficit. He exhibits normal muscle tone. Coordination normal.  Skin: He is not diaphoretic.  Psychiatric: He has a normal mood and affect. His behavior is normal. Judgment and thought content normal.  Nursing note and vitals reviewed.    UC Treatments / Results  Labs (all labs ordered are listed, but only abnormal results are displayed) Labs Reviewed - No data to display  EKG  EKG Interpretation None       Radiology No results found.  Procedures Procedures (including critical care time)  Medications Ordered in  UC Medications - No data to display   Initial Impression / Assessment and Plan / UC Course  I have reviewed the triage vital signs and the nursing notes.  Pertinent labs & imaging results that were available during my care of the patient were reviewed by me and considered in my medical decision making (see chart for details).       Final Clinical Impressions(s) / UC  Diagnoses   Final diagnoses:  Primary insomnia    New Prescriptions Discharge Medication List as of 08/01/2016 10:16 AM    START taking these medications   Details  zolpidem (AMBIEN) 5 MG tablet Take 1 tablet (5 mg total) by mouth at bedtime as needed for sleep., Starting Mon 08/01/2016, Print       1. diagnosis reviewed with patient 2. rx as per orders above; reviewed possible side effects, interactions, risks and benefits  3. Follow up with PCP 4.Follow-up prn    Norval Gable, MD 08/01/16 2224

## 2016-08-01 NOTE — ED Triage Notes (Signed)
Patient reports trouble sleeping for the past 3 days.  Patient reports cough for the past 3 days also.

## 2016-08-02 DIAGNOSIS — G479 Sleep disorder, unspecified: Secondary | ICD-10-CM | POA: Diagnosis not present

## 2016-08-02 DIAGNOSIS — J00 Acute nasopharyngitis [common cold]: Secondary | ICD-10-CM | POA: Diagnosis not present

## 2016-09-22 DIAGNOSIS — F1190 Opioid use, unspecified, uncomplicated: Secondary | ICD-10-CM | POA: Diagnosis not present

## 2016-09-30 DIAGNOSIS — M1611 Unilateral primary osteoarthritis, right hip: Secondary | ICD-10-CM | POA: Diagnosis not present

## 2016-11-04 DIAGNOSIS — E7800 Pure hypercholesterolemia, unspecified: Secondary | ICD-10-CM | POA: Diagnosis not present

## 2016-11-04 DIAGNOSIS — G479 Sleep disorder, unspecified: Secondary | ICD-10-CM | POA: Diagnosis not present

## 2016-11-04 DIAGNOSIS — Z79899 Other long term (current) drug therapy: Secondary | ICD-10-CM | POA: Diagnosis not present

## 2016-11-04 DIAGNOSIS — Z23 Encounter for immunization: Secondary | ICD-10-CM | POA: Diagnosis not present

## 2016-11-04 DIAGNOSIS — I10 Essential (primary) hypertension: Secondary | ICD-10-CM | POA: Diagnosis not present

## 2016-11-04 DIAGNOSIS — K219 Gastro-esophageal reflux disease without esophagitis: Secondary | ICD-10-CM | POA: Diagnosis not present

## 2016-11-04 DIAGNOSIS — E119 Type 2 diabetes mellitus without complications: Secondary | ICD-10-CM | POA: Diagnosis not present

## 2016-11-08 DIAGNOSIS — E538 Deficiency of other specified B group vitamins: Secondary | ICD-10-CM | POA: Diagnosis not present

## 2016-11-11 ENCOUNTER — Other Ambulatory Visit: Payer: Commercial Managed Care - HMO

## 2016-11-11 DIAGNOSIS — C61 Malignant neoplasm of prostate: Secondary | ICD-10-CM | POA: Diagnosis not present

## 2016-11-12 LAB — PSA: Prostate Specific Ag, Serum: 1.8 ng/mL (ref 0.0–4.0)

## 2016-11-16 DIAGNOSIS — E538 Deficiency of other specified B group vitamins: Secondary | ICD-10-CM | POA: Diagnosis not present

## 2016-11-18 ENCOUNTER — Encounter: Payer: Self-pay | Admitting: Urology

## 2016-11-18 ENCOUNTER — Ambulatory Visit (INDEPENDENT_AMBULATORY_CARE_PROVIDER_SITE_OTHER): Payer: Medicare HMO | Admitting: Urology

## 2016-11-18 VITALS — BP 118/66 | HR 73 | Ht 68.0 in | Wt 180.0 lb

## 2016-11-18 DIAGNOSIS — Z8546 Personal history of malignant neoplasm of prostate: Secondary | ICD-10-CM | POA: Diagnosis not present

## 2016-11-18 DIAGNOSIS — K5903 Drug induced constipation: Secondary | ICD-10-CM | POA: Diagnosis not present

## 2016-11-18 DIAGNOSIS — N3941 Urge incontinence: Secondary | ICD-10-CM | POA: Diagnosis not present

## 2016-11-18 MED ORDER — DOCUSATE SODIUM 100 MG PO CAPS: 100.0000 mg | ORAL_CAPSULE | Freq: Two times a day (BID) | ORAL | 6 refills | Status: DC

## 2016-11-18 NOTE — Patient Instructions (Signed)
The Male Pelvic Floor Muscles  The pelvic floor consists of several layers of muscles that cover the bottom of the pelvic cavity. These muscles have several distinct roles:  1. To support the pelvic organs, the bladder and colon within the pelvis. 2. To assist in stopping and starting the flow of urine or the passage of gas or stool. 3. To aid in sexual appreciation.    How to Locate the Pelvic Floor Muscles  The Urine Stop Test . At the midstream of your urine flow, squeeze the pelvic floor muscles. You should feel the sensation of the openings close and the muscles pulling the penis and anus up and in to the pelvic cavity.  If you have strong muscles you will slow or stop the stream of urine. . Try to stop or slow the flow of urine without tensing the muscles of your legs, buttocks. . Do this only to locate the muscles, not as a daily exercise. Feeling the Muscle . Place a fingertip on or into the rectal opening.  Contract and lift the muscles as though you were holding back gas or a bowel movement.   . You will feel your anal opening tighten and your penis move slightly. Watching the Muscles Contract . Begin by lying on a flat surface.  Position yourself with your knees apart and bent with your head elevated and supported on several pillows.  Use a mirror to look at the anal opening and penis.  . Contract or tighten the muscles around the anal opening and watch for a puckering and lifting of the anus and slight movement of the penis.   . If you see a bulge of your anus this is an incorrect contraction and you should notify your health care provider for more instructions.   2007, [employer] Doc.12 

## 2016-11-18 NOTE — Progress Notes (Signed)
11/18/2016 9:11 AM   Justin Greene 1934-07-10 [id number]  Referring provider: Sherrin Daisy, MD No address on file  Chief Complaint  Patient presents with  . Prostate Cancer    1year    HPI: 82 yo M wit history of prostate cancer who returns for annual follow up.      He was diagnosed with clinical stage T1c, Gleason 3+4 adenocarcinoma the prostate in September 2007 and completed IMRT in 2008. He does not recall ever receiving ADT.  Recent PSA trend: 0.7  2014 0.9  10/2013 1.4  10/2014 1.5  10/2015 1.8  10/2016  Today, he continues to complain of occasional episodes of what sounds like urge incontinence. He reports that he will be walking, or standing and suddenly have several tablespoons of urine drained. He has no sensation like he is going to prior to this.  He is very bothered by this. He is not currently wearing any pads. He denies any leakage with laughing, coughing, or sneezing.  Justin Greene He does not have any baseline urgency or frequency. He only gets up once at night to your void. His urinary stream is excellent and does feel as though he is able to empty his bladder.  No history of UTIs.   He does also complain of hip pain today. He reports that he needs a hip replacement but does not want to do this. He's been managed with tramadol. He does endorse constipation today.  PMH: Past Medical History:  Diagnosis Date  . Acid reflux   . Diabetes mellitus without complication ([place])   . Gout   . Heart disease   . Heart disease   . Hyperlipidemia   . Hypertension   . Hypertension   . Prostate cancer [facility])     Surgical History: Past Surgical History:  Procedure Laterality Date  . CARDIAC SURGERY      Home Medications:  Allergies as of 11/18/2016      Reactions   Sulfa Antibiotics Rash      Medication List       Accurate as of 11/18/16  9:11 AM. Always use your most recent med list.          amLODipine 5 MG tablet Commonly known as:  NORVASC Take 5  mg by mouth.   aspirin EC 81 MG tablet Take 81 mg by mouth.   atenolol 25 MG tablet Commonly known as:  TENORMIN Take 25 mg by mouth.   docusate sodium 100 MG capsule Commonly known as:  COLACE Take 1 capsule (100 mg total) by mouth 2 (two) times daily.   DUREZOL 0.05 % Emul Generic drug:  Difluprednate   glimepiride 2 MG tablet Commonly known as:  AMARYL Take 2 mg by mouth.   lovastatin 20 MG tablet Commonly known as:  MEVACOR Take 20 mg by mouth daily at 6 PM.   metFORMIN 500 MG tablet Commonly known as:  GLUCOPHAGE 500 mg 2 (two) times daily with a meal.   pantoprazole 40 MG tablet Commonly known as:  PROTONIX   PLAVIX 75 MG tablet Generic drug:  clopidogrel Take 75 mg by mouth.   pregabalin 150 MG capsule Commonly known as:  LYRICA Take 150 mg by mouth 2 (two) times daily.   traMADol 50 MG tablet Commonly known as:  ULTRAM Take 75 mg by mouth every 12 (twelve) hours as needed.   traZODone 50 MG tablet Commonly known as:  DESYREL       Allergies:  Allergies  Allergen Reactions  . Sulfa Antibiotics Rash    Family History: Family History  Problem Relation Age of Onset  . Kidney failure Sister   . Prostate cancer Father   . Ulcers Father   . Diabetes Sister   . Ulcers Sister     Social History:  reports that he has never smoked. He has never used smokeless tobacco. He reports that he does not drink alcohol or use drugs.  ROS: UROLOGY Frequent Urination?: No Hard to postpone urination?: No Burning/pain with urination?: No Get up at night to urinate?: No Leakage of urine?: Yes Urine stream starts and stops?: No Trouble starting stream?: No Do you have to strain to urinate?: No Blood in urine?: No Urinary tract infection?: No Sexually transmitted disease?: No Injury to kidneys or bladder?: No Painful intercourse?: No Weak stream?: No Erection problems?: No Penile pain?: No  Gastrointestinal Nausea?: No Vomiting?:  No Indigestion/heartburn?: No Diarrhea?: No Constipation?: No  Constitutional Fever: No Night sweats?: No Weight loss?: No Fatigue?: No  Skin Skin rash/lesions?: No Itching?: No  Eyes Blurred vision?: No Double vision?: No  Ears/Nose/Throat Sore throat?: No Sinus problems?: No  Hematologic/Lymphatic Swollen glands?: No Easy bruising?: No  Cardiovascular Leg swelling?: No Chest pain?: No  Respiratory Cough?: No Shortness of breath?: No  Endocrine Excessive thirst?: No  Musculoskeletal Back pain?: No Joint pain?: No  Neurological Headaches?: No Dizziness?: No  Psychologic Depression?: No Anxiety?: No  Physical Exam: BP 118/66   Pulse 73   Ht 5\' 8"  (1.727 m)   Wt 180 lb (81.6 kg)   BMI 27.37 kg/m   Constitutional:  Alert and oriented, No acute distress. HEENT: [place] AT, moist mucus membranes.  Trachea midline, no masses. Cardiovascular: No clubbing, cyanosis, or edema. Respiratory: Normal respiratory effort, no increased work of breathing. GI: Abdomen is soft, nontender, nondistended, no abdominal masses Rectal exam: today with normal sphincter tone, 30+ gram prostate, diffusely firm, no nodules, nontender. Skin: No rashes, bruises or suspicious lesions. Neurologic: Grossly intact, no focal deficits, moving all 4 extremities. Psychiatric: Normal mood and affect.  Laboratory Data: PSA trend as above  Pertinent Imaging: n/a  Assessment & Plan:  82 year old male with remote history of Gleason 3+4 prostate cancer status post EBRT in 2008.  He also has episodes of urge incontinence.  1. Prostate cancer PSA rising from 1.8 from 1.4 last year Will continue to follow annually- very slow doubling time - PSA  2. Urge incontinence of urine Discuss strategies for urge incontinence including behavioral modification Address constipation as below Pelvic floor exercises in case this is stress incontinence, although symptoms are more consistent with  urge Return to care to discuss pharmacotherapy if above measures fail  3. Drug induced constipation Discussed the importance of daily bowel movement and possible relationship to his urinary symptoms. I prescribed Colace twice a day.  Return in about 1 year (around 11/18/2017) for PSA.  Justin Espy, MD  [facility] [address] [place], [place] [address] [phone]

## 2016-11-23 DIAGNOSIS — E538 Deficiency of other specified B group vitamins: Secondary | ICD-10-CM | POA: Diagnosis not present

## 2016-11-30 DIAGNOSIS — E538 Deficiency of other specified B group vitamins: Secondary | ICD-10-CM | POA: Diagnosis not present

## 2016-12-14 DIAGNOSIS — G479 Sleep disorder, unspecified: Secondary | ICD-10-CM | POA: Diagnosis not present

## 2017-01-31 DIAGNOSIS — M48062 Spinal stenosis, lumbar region with neurogenic claudication: Secondary | ICD-10-CM | POA: Diagnosis not present

## 2017-01-31 DIAGNOSIS — M1611 Unilateral primary osteoarthritis, right hip: Secondary | ICD-10-CM | POA: Diagnosis not present

## 2017-01-31 DIAGNOSIS — M5416 Radiculopathy, lumbar region: Secondary | ICD-10-CM | POA: Diagnosis not present

## 2017-02-15 DIAGNOSIS — M1611 Unilateral primary osteoarthritis, right hip: Secondary | ICD-10-CM | POA: Diagnosis not present

## 2017-07-26 ENCOUNTER — Other Ambulatory Visit (INDEPENDENT_AMBULATORY_CARE_PROVIDER_SITE_OTHER): Payer: Self-pay | Admitting: Vascular Surgery

## 2017-11-14 ENCOUNTER — Other Ambulatory Visit: Payer: Self-pay

## 2017-11-14 ENCOUNTER — Other Ambulatory Visit: Payer: Medicare Other

## 2017-11-14 DIAGNOSIS — Z8546 Personal history of malignant neoplasm of prostate: Secondary | ICD-10-CM

## 2017-11-15 LAB — PSA: Prostate Specific Ag, Serum: 2.1 ng/mL (ref 0.0–4.0)

## 2017-11-22 ENCOUNTER — Encounter: Payer: Self-pay | Admitting: Urology

## 2017-11-22 ENCOUNTER — Ambulatory Visit: Payer: Medicare Other | Admitting: Urology

## 2017-11-22 VITALS — BP 121/71 | HR 79 | Ht 68.0 in | Wt 172.0 lb

## 2017-11-22 DIAGNOSIS — N3941 Urge incontinence: Secondary | ICD-10-CM

## 2017-11-22 DIAGNOSIS — Z8546 Personal history of malignant neoplasm of prostate: Secondary | ICD-10-CM

## 2017-11-22 DIAGNOSIS — R9721 Rising PSA following treatment for malignant neoplasm of prostate: Secondary | ICD-10-CM

## 2017-11-22 NOTE — Progress Notes (Signed)
11/22/2017 4:41 PM   Justin Greene [birth year] [id number]  Referring provider: Sallee Lange, NP [address] [place], [place] [address]  Chief Complaint  Patient presents with  . Prostate Cancer    1year    HPI: 83 yo M wit history of prostate cancer who returns for annual follow up.      He was diagnosed with clinical stage T1c, Gleason 3+4 adenocarcinoma the prostate in September 2007 and completed IMRT in 2008. He does not recall ever receiving ADT.  Recent PSA trend: 0.7  2014 0.9  10/2013 1.4  10/2014 1.5  10/2015 1.8  10/2016 2.1  10/2017   He does not have any baseline urgency or frequency. His urinary stream is excellent and does feel as though he is able to empty his bladder.  No history of UTIs. He does have mild urge incontinence and  he weas 1 ppd and has minimal bother for this.  Nocturia x 2   He continues to have right hip pain and is planned to have hip replacement at [facility], Dr. Bobby Rumpf in the near future.  He has had imaging suggestive of arthritis (MRI).  He has preop tomorrow.    PMH: Past Medical History:  Diagnosis Date  . Acid reflux   . Diabetes mellitus without complication ([place])   . Gout   . Heart disease   . Heart disease   . Hyperlipidemia   . Hypertension   . Hypertension   . Prostate cancer [facility])     Surgical History: Past Surgical History:  Procedure Laterality Date  . CARDIAC SURGERY      Home Medications:  Allergies as of 11/22/2017      Reactions   Sulfa Antibiotics Rash      Medication List        Accurate as of 11/22/17 11:59 PM. Always use your most recent med list.          amLODipine 5 MG tablet Commonly known as:  NORVASC Take 5 mg by mouth.   aspirin EC 81 MG tablet Take 81 mg by mouth.   atenolol 25 MG tablet Commonly known as:  TENORMIN Take 25 mg by mouth.   glimepiride 2 MG tablet Commonly known as:  AMARYL Take 2 mg by mouth.   lovastatin 20 MG tablet Commonly known as:   MEVACOR Take 20 mg by mouth daily at 6 PM.   metFORMIN 500 MG tablet Commonly known as:  GLUCOPHAGE 500 mg 2 (two) times daily with a meal.   pantoprazole 40 MG tablet Commonly known as:  PROTONIX   PLAVIX 75 MG tablet Generic drug:  clopidogrel Take 75 mg by mouth.   traMADol 50 MG tablet Commonly known as:  ULTRAM Take 75 mg by mouth every 12 (twelve) hours as needed.       Allergies:  Allergies  Allergen Reactions  . Sulfa Antibiotics Rash    Family History: Family History  Problem Relation Age of Onset  . Kidney failure Sister   . Prostate cancer Father   . Ulcers Father   . Diabetes Sister   . Ulcers Sister     Social History:  reports that he has never smoked. He has never used smokeless tobacco. He reports that he does not drink alcohol or use drugs.  ROS: UROLOGY Frequent Urination?: No Hard to postpone urination?: No Burning/pain with urination?: No Get up at night to urinate?: Yes Leakage of urine?: Yes Urine stream starts and stops?: No  Trouble starting stream?: No Do you have to strain to urinate?: No Blood in urine?: No Urinary tract infection?: No Sexually transmitted disease?: No Injury to kidneys or bladder?: No Painful intercourse?: No Weak stream?: No Erection problems?: No Penile pain?: No  Gastrointestinal Nausea?: No Vomiting?: No Indigestion/heartburn?: No Diarrhea?: No Constipation?: No  Constitutional Fever: No Night sweats?: No Weight loss?: No Fatigue?: No  Skin Skin rash/lesions?: No Itching?: No  Eyes Blurred vision?: No Double vision?: No  Ears/Nose/Throat Sore throat?: No Sinus problems?: No  Hematologic/Lymphatic Swollen glands?: No Easy bruising?: No  Cardiovascular Leg swelling?: No Chest pain?: No  Respiratory Cough?: No Shortness of breath?: No  Endocrine Excessive thirst?: No  Musculoskeletal Back pain?: Yes Joint pain?: Yes  Neurological Headaches?: No Dizziness?:  No  Psychologic Depression?: No Anxiety?: No  Physical Exam: BP 121/71   Pulse 79   Ht 5\' 8"  (1.727 m)   Wt 172 lb (78 kg)   BMI 26.15 kg/m   Constitutional:  Alert and oriented, No acute distress.  Ambulating with cane. HEENT: [place] AT, moist mucus membranes.  Trachea midline, no masses. Cardiovascular: No clubbing, cyanosis, or edema. Respiratory: Normal respiratory effort, no increased work of breathing. Rectal: Normal sphincter tone, 30 cc prostate, not tender, no nodules. Skin: No rashes, bruises or suspicious lesions. Neurologic: Grossly intact, no focal deficits, moving all 4 extremities. Psychiatric: Normal mood and affect.  Laboratory Data: PSA trend as above  Creatinine 1.0 on 04/2017  Urinalysis N/a  Pertinent Imaging: N/A  Assessment & Plan:     1. History of prostate cancer Personal history of prostate cancer status post IMRT PSA continues to slowly rise At this point time, he does not yet meet the definition of biochemical recurrence although this level of return is somewhat concerning PSA doubling time extremely low, will continue to follow conservatively at this point time  2. Urge incontinence of urine Minimal bother, wears 1 pad per day We discussed pharmacotherapy, no interest in this  3. Rising PSA following treatment for malignant neoplasm of prostate As above   Return in about 1 year (around 11/23/2018) for PSA.  [name], MD  [facility] [address] [place], [place] [address] [phone]

## 2018-01-23 DIAGNOSIS — I340 Nonrheumatic mitral (valve) insufficiency: Secondary | ICD-10-CM | POA: Insufficient documentation

## 2018-03-29 ENCOUNTER — Encounter: Payer: Self-pay | Admitting: Emergency Medicine

## 2018-03-29 ENCOUNTER — Ambulatory Visit
Admission: EM | Admit: 2018-03-29 | Discharge: 2018-03-29 | Disposition: A | Discharge status: Home / Self Care | Payer: Medicare Other | Attending: Family Medicine | Admitting: Family Medicine

## 2018-03-29 ENCOUNTER — Other Ambulatory Visit: Payer: Self-pay

## 2018-03-29 DIAGNOSIS — J029 Acute pharyngitis, unspecified: Secondary | ICD-10-CM

## 2018-03-29 MED ORDER — LIDOCAINE VISCOUS HCL 2 % MT SOLN: OROMUCOSAL | 0 refills | Status: DC

## 2018-03-29 MED ORDER — BENZONATATE 100 MG PO CAPS: 100.0000 mg | ORAL_CAPSULE | Freq: Three times a day (TID) | ORAL | 0 refills | Status: DC | PRN

## 2018-03-29 NOTE — Discharge Instructions (Signed)
Medication as prescribed.  Take care  Dr. Baldwin Racicot  

## 2018-03-29 NOTE — ED Triage Notes (Signed)
Patient c/o sore throat and cough that started yesterday. Patient has not taken any OTC medications.

## 2018-03-29 NOTE — ED Provider Notes (Signed)
[facility]    CSN: [id number] Arrival date & time: 03/29/18  1430  History   Chief Complaint Chief Complaint  Patient presents with  . Sore Throat   HPI  83 year old male presents with sore throat.  Started yesterday.  Reports sore throat, cough, hoarseness.  Worse as of last night.  Worse today.  No medications or interventions tried.  No fever.  No known exacerbating factors.  No other associated symptoms.  No other complaints.  PMH, Surgical Hx, Family Hx, Social History reviewed and updated as below.  Past Medical History:  Diagnosis Date  . Acid reflux   . Diabetes mellitus without complication ([place])   . Gout   . Heart disease   . Heart disease   . Hyperlipidemia   . Hypertension   . Hypertension   . Prostate cancer [facility])     Patient Active Problem List   Diagnosis Date Noted  . Primary osteoarthritis of right hip 07/01/2015  . BP (high blood pressure) 11/21/2014  . Diabetes mellitus type 2, uncomplicated ([place]) 84/13/2440  . Essential (primary) hypertension 11/04/2013  . 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 Cardiomyopathy ([place]) 09/19/2013  . Claudication ([place]) 09/19/2013  . Acid reflux 09/19/2013  . HLD (hyperlipidemia) 09/19/2013  . Peripheral vascular disease ([place]) 09/19/2013  . Cardiomyopathy, idiopathic ([place]) 09/19/2013  . Urge incontinence 10/03/2012  . FOM (frequency of micturition) 10/03/2012  . Erectile dysfunction 02/08/2012  . Prostate cancer ([place]) 02/08/2012    Past Surgical History:  Procedure Laterality Date  . CARDIAC SURGERY    . JOINT REPLACEMENT         Home Medications    Prior to Admission medications   Medication Sig Start Date End Date Taking? Authorizing Provider  amLODipine (NORVASC) 5 MG tablet Take 5 mg by mouth. 02/08/12  Yes [provider]  aspirin EC 81 MG tablet Take 81 mg by mouth. 02/08/12  Yes [provider]  atenolol (TENORMIN) 25  MG tablet Take 25 mg by mouth. 02/08/12  Yes [provider]  clopidogrel (PLAVIX) 75 MG tablet Take 75 mg by mouth. 02/08/12  Yes [provider]  glimepiride (AMARYL) 2 MG tablet Take 2 mg by mouth. 02/08/12  Yes [provider]  lovastatin (MEVACOR) 20 MG tablet Take 20 mg by mouth daily at 6 PM.   Yes [provider]  metFORMIN (GLUCOPHAGE) 500 MG tablet 500 mg 2 (two) times daily with a meal.  11/20/14  Yes [provider]  pantoprazole (PROTONIX) 40 MG tablet  07/24/12  Yes [provider]  traMADol (ULTRAM) 50 MG tablet Take 75 mg by mouth every 12 (twelve) hours as needed.   Yes [provider]  benzonatate (TESSALON) 100 MG capsule Take 1 capsule (100 mg total) by mouth 3 (three) times daily as needed. 03/29/18   Coral Spikes, DO  lidocaine (XYLOCAINE) 2 % solution Gargle 15 mL every 3 hours as needed. May swallow if desired. 03/29/18   Coral Spikes, DO    Family History Family History  Problem Relation Age of Onset  . Kidney failure Sister   . Prostate cancer Father   . Ulcers Father   . Diabetes Sister   . Ulcers Sister     Social History Social History   Tobacco Use  . Smoking status: Never Smoker  . Smokeless tobacco: Never Used  Substance Use Topics  . Alcohol use: No    Alcohol/week: 0.0 standard  drinks  . Drug use: No     Allergies   Sulfa antibiotics   Review of Systems Review of Systems  HENT: Positive for sore throat and voice change.   Respiratory: Positive for cough.    Physical Exam Triage Vital Signs ED Triage Vitals  Enc Vitals Group     BP 03/29/18 1450 (!) 141/67     Pulse Rate 03/29/18 1450 73     Resp 03/29/18 1450 18     Temp 03/29/18 1450 97.9 F (36.6 C)     Temp Source 03/29/18 1450 Oral     SpO2 03/29/18 1450 98 %     Weight 03/29/18 1448 168 lb (76.2 kg)     Height 03/29/18 1448 5\' 8"  (1.727 m)     Head Circumference --      Peak Flow --      Pain Score 03/29/18 1448  5     Pain Loc --      Pain Edu? --      Excl. in [place]? --    Updated Vital Signs BP (!) 141/67 (BP Location: Right Arm)   Pulse 73   Temp 97.9 F (36.6 C) (Oral)   Resp 18   Ht 5\' 8"  (1.727 m)   Wt 76.2 kg   SpO2 98%   BMI 25.54 kg/m   Visual Acuity Right Eye Distance:   Left Eye Distance:   Bilateral Distance:    Right Eye Near:   Left Eye Near:    Bilateral Near:     Physical Exam  Constitutional: He is oriented to person, place, and time. He appears well-developed. No distress.  HENT:  Head: Normocephalic and atraumatic.  Oropharynx with mild erythema.  Eyes: Conjunctivae are normal. Right eye exhibits no discharge. Left eye exhibits no discharge.  Cardiovascular: Normal rate and regular rhythm.  Pulmonary/Chest: Effort normal and breath sounds normal. He has no wheezes. He has no rales.  Neurological: He is alert and oriented to person, place, and time.  Psychiatric: He has a normal mood and affect. His behavior is normal.  Nursing note and vitals reviewed.  UC Treatments / Results  Labs (all labs ordered are listed, but only abnormal results are displayed) Labs Reviewed - No data to display  EKG None  Radiology No results found.  Procedures Procedures (including critical care time)  Medications Ordered in UC Medications - No data to display  Initial Impression / Assessment and Plan / UC Course  I have reviewed the triage vital signs and the nursing notes.  Pertinent labs & imaging results that were available during my care of the patient were reviewed by me and considered in my medical decision making (see chart for details).    83 year old male presents with viral illness.  Viscous Lidocaine and Tessalon Per[place].  Final Clinical Impressions(s) / UC Diagnoses   Final diagnoses:  Viral pharyngitis     Discharge Instructions     Medication as prescribed.  Take care  Dr. Lacinda Axon   ED Prescriptions    Medication Sig Dispense Auth. Provider     lidocaine (XYLOCAINE) 2 % solution Gargle 15 mL every 3 hours as needed. May swallow if desired. 200 mL Donjuan Robison G, DO   benzonatate (TESSALON) 100 MG capsule Take 1 capsule (100 mg total) by mouth 3 (three) times daily as needed. 30 capsule Coral Spikes, DO     Controlled Substance Prescriptions [place] Controlled Substance Registry consulted? Not Applicable   Coral Spikes,  DO 03/29/18 1753

## 2018-11-21 ENCOUNTER — Other Ambulatory Visit: Payer: Self-pay

## 2018-11-21 DIAGNOSIS — Z8546 Personal history of malignant neoplasm of prostate: Secondary | ICD-10-CM

## 2018-11-22 ENCOUNTER — Other Ambulatory Visit: Payer: Self-pay

## 2018-11-22 ENCOUNTER — Other Ambulatory Visit: Payer: Medicare Other

## 2018-11-22 DIAGNOSIS — Z8546 Personal history of malignant neoplasm of prostate: Secondary | ICD-10-CM

## 2018-11-23 LAB — PSA: Prostate Specific Ag, Serum: 2.6 ng/mL (ref 0.0–4.0)

## 2018-11-27 ENCOUNTER — Ambulatory Visit (INDEPENDENT_AMBULATORY_CARE_PROVIDER_SITE_OTHER): Payer: Medicare Other | Admitting: Urology

## 2018-11-27 ENCOUNTER — Encounter: Payer: Self-pay | Admitting: Urology

## 2018-11-27 ENCOUNTER — Other Ambulatory Visit: Payer: Self-pay

## 2018-11-27 VITALS — BP 132/72 | HR 77 | Ht 68.0 in | Wt 172.0 lb

## 2018-11-27 DIAGNOSIS — Z8546 Personal history of malignant neoplasm of prostate: Secondary | ICD-10-CM | POA: Diagnosis not present

## 2018-11-27 DIAGNOSIS — N3941 Urge incontinence: Secondary | ICD-10-CM

## 2018-11-27 DIAGNOSIS — R9721 Rising PSA following treatment for malignant neoplasm of prostate: Secondary | ICD-10-CM | POA: Diagnosis not present

## 2018-11-27 NOTE — Progress Notes (Signed)
11/27/2018 10:09 AM   Justin Greene 04-Nov-1934 [id number]  Referring provider: Sallee Lange, NP [address] [place],  [place] [address]  Chief Complaint  Patient presents with  . Prostate Cancer    HPI: 84 yo M with prostate cancer who returns today for routine follow up.  He was last seen 10/2017.  He was diagnosed with clinical stage T1c, Gleason 3+4 adenocarcinoma the prostate in September 2007 and completed IMRT in 2008. He does not recall ever receiving ADT.  His PSA continues to rise, most recently to 2.6 on 10/2018.    He denies any urinary issues.  No sensation of incomplete bladder emptying.  He occasionally has some urgency without daytime frequency.  Occasional urge incontinence.   Nocturia x 1.  No gross hematuria or infections.   Minimal weight loss.  No pain other than occational dull aching in is how back from time to time which he attributes this to arthritis.   Recent PSA trend: 0.7 2014 0.9 10/2013 1.4 10/2014 1.5 10/2015 1.8 10/2016 2.1  10/2017 2.6  10/2018  PMH: Past Medical History:  Diagnosis Date  . Acid reflux   . Diabetes mellitus without complication ([place])   . Gout   . Heart disease   . Heart disease   . Hyperlipidemia   . Hypertension   . Hypertension   . Prostate cancer [facility])     Surgical History: Past Surgical History:  Procedure Laterality Date  . CARDIAC SURGERY    . JOINT REPLACEMENT      Home Medications:  Allergies as of 11/27/2018      Reactions   Sulfa Antibiotics Rash      Medication List       Accurate as of November 27, 2018 10:09 AM. If you have any questions, ask your nurse or doctor.        amLODipine 5 MG tablet Commonly known as: NORVASC Take 5 mg by mouth.   aspirin EC 81 MG tablet Take 81 mg by mouth.   atenolol 25 MG tablet Commonly known as: TENORMIN Take 25 mg by mouth.   benzonatate 100 MG capsule Commonly known as: TESSALON Take 1 capsule (100 mg total) by mouth 3 (three)  times daily as needed.   glimepiride 2 MG tablet Commonly known as: AMARYL Take 2 mg by mouth.   lidocaine 2 % solution Commonly known as: XYLOCAINE Gargle 15 mL every 3 hours as needed. May swallow if desired.   lovastatin 20 MG tablet Commonly known as: MEVACOR Take 20 mg by mouth daily at 6 PM.   metFORMIN 500 MG tablet Commonly known as: GLUCOPHAGE 500 mg 2 (two) times daily with a meal.   pantoprazole 40 MG tablet Commonly known as: PROTONIX   Plavix 75 MG tablet Generic drug: clopidogrel Take 75 mg by mouth.   traMADol 50 MG tablet Commonly known as: ULTRAM Take 75 mg by mouth every 12 (twelve) hours as needed.       Allergies:  Allergies  Allergen Reactions  . Sulfa Antibiotics Rash    Family History: Family History  Problem Relation Age of Onset  . Kidney failure Sister   . Prostate cancer Father   . Ulcers Father   . Diabetes Sister   . Ulcers Sister     Social History:  reports that he has never smoked. He has never used smokeless tobacco. He reports that he does not drink alcohol or use drugs.  ROS: UROLOGY Frequent Urination?: Yes  Hard to postpone urination?: No Burning/pain with urination?: No Get up at night to urinate?: Yes Leakage of urine?: No Urine stream starts and stops?: No Trouble starting stream?: No Do you have to strain to urinate?: No Blood in urine?: No Urinary tract infection?: No Sexually transmitted disease?: No Injury to kidneys or bladder?: No Painful intercourse?: No Weak stream?: No Erection problems?: No Penile pain?: No  Gastrointestinal Nausea?: No Vomiting?: No Indigestion/heartburn?: No Diarrhea?: No Constipation?: No  Constitutional Fever: No Night sweats?: No Weight loss?: No Fatigue?: No  Skin Skin rash/lesions?: No Itching?: No  Eyes Blurred vision?: No Double vision?: No  Ears/Nose/Throat Sore throat?: No Sinus problems?: No  Hematologic/Lymphatic Swollen glands?: No Easy  bruising?: No  Cardiovascular Leg swelling?: No Chest pain?: No  Respiratory Cough?: No Shortness of breath?: No  Endocrine Excessive thirst?: No  Musculoskeletal Back pain?: Yes Joint pain?: No  Neurological Headaches?: No Dizziness?: No  Psychologic Depression?: No Anxiety?: No  Physical Exam: BP 132/72   Pulse 77   Ht 5\' 8"  (1.727 m)   Wt 172 lb (78 kg)   BMI 26.15 kg/m   Constitutional:  Alert and oriented, No acute distress. HEENT: [place] AT, moist mucus membranes.  Trachea midline, no masses. Cardiovascular: No clubbing, cyanosis, or edema. Respiratory: Normal respiratory effort, no increased work of breathing. GI: Abdomen is soft, nontender, nondistended, no abdominal masses Skin: No rashes, bruises or suspicious lesions. Neurologic: Grossly intact, no focal deficits, moving all 4 extremities. Psychiatric: Normal mood and affect.  Laboratory Data: PSA as above   Assessment & Plan:    1. History of prostate cancer PSA continues to rise, almost 2 mg/dL above his previous nadir of 0.7 but is not quite yet meet the criteria for biochemical recurrence  Given his age and comorbidities, will be somewhat conservative.  We will plan to recheck his PSA in 6 months.  If his PSA at that point meets criteria, will proceed with repeat staging and consideration of salvage/adjuvant treatment versus watchful waiting  He is agreeable this plan - PSA; Future  2. Rising PSA following treatment for malignant neoplasm of prostate As above  3. Urge incontinence of urine Occasional urge incontinence Wears a pad/diaper for this Discussed pharmacotherapy along with behavioral health modification, not interested at this time  Return in about 6 months (around 05/30/2019) for PSA prior.  [name], MD  [facility] [address], [place], [place] [address] [phone]

## 2019-06-03 ENCOUNTER — Encounter: Payer: Self-pay | Admitting: Urology

## 2019-06-03 ENCOUNTER — Other Ambulatory Visit: Payer: Medicare Other

## 2019-06-06 ENCOUNTER — Ambulatory Visit: Payer: Medicare Other | Admitting: Urology

## 2019-06-27 ENCOUNTER — Other Ambulatory Visit: Payer: Self-pay

## 2019-06-27 ENCOUNTER — Other Ambulatory Visit
Admission: RE | Admit: 2019-06-27 | Discharge: 2019-06-27 | Disposition: A | Discharge status: Home / Self Care | Payer: Medicare Other | Attending: Urology | Admitting: Urology

## 2019-06-27 DIAGNOSIS — Z8546 Personal history of malignant neoplasm of prostate: Secondary | ICD-10-CM | POA: Insufficient documentation

## 2019-06-27 LAB — PSA: Prostatic Specific Antigen: 2.54 ng/mL (ref 0.00–4.00)

## 2019-06-27 NOTE — Progress Notes (Signed)
06/28/2019 3:21 PM   [name] July 09, 1934 [id number]   Referring provider: Sallee Lange, NP [address] [place],  [place] [address]  Chief Complaint  Patient presents with  . Prostate Cancer    36mo follow up    HPI: Justin Greene is a 85 yo M with a hx of poorly controlled diabetes returns today for a 6 month f/u for the evaluation and management of prostate cancer.   He was diagnosed with clinical stage T1c, Gleason 3+4 adenocarcinoma the prostate in September 2007 and completed IMRT in 2008. He does not recall ever receiving ADT.  His most recent PSA from 06/27/19 was 2.46.    He has been living a healthier lifestyle and feels great but has trouble with getting to the bathroom on time which occurs sometimes.  This does not happen on a daily basis but is very bothersome to him.  He does not wait too long to get to the bathroom except for when he sleeps.  He does also have some mild frequency.  Most recent A1c 7.9.   Recent PSA trend: 0.7 2014 0.9 10/2013 1.4 10/2014 1.5 10/2015 1.8 10/2016 2.1 10/2017 2.6  10/2018 2.46 06/27/2019  PMH: Past Medical History:  Diagnosis Date  . Acid reflux   . Diabetes mellitus without complication ([place])   . Gout   . Heart disease   . Heart disease   . Hyperlipidemia   . Hypertension   . Hypertension   . Prostate cancer [facility])     Surgical History: Past Surgical History:  Procedure Laterality Date  . CARDIAC SURGERY    . JOINT REPLACEMENT      Home Medications:  Allergies as of 06/28/2019      Reactions   Sulfa Antibiotics Rash      Medication List       Accurate as of June 28, 2019  3:21 PM. If you have any questions, ask your nurse or doctor.        amLODipine 5 MG tablet Commonly known as: NORVASC Take 5 mg by mouth.   aspirin EC 81 MG tablet Take 81 mg by mouth.   atenolol 25 MG tablet Commonly known as: TENORMIN Take 25 mg by mouth.   benzonatate 100 MG capsule Commonly known  as: TESSALON Take 1 capsule (100 mg total) by mouth 3 (three) times daily as needed.   glimepiride 2 MG tablet Commonly known as: AMARYL Take 2 mg by mouth.   lidocaine 2 % solution Commonly known as: XYLOCAINE Gargle 15 mL every 3 hours as needed. May swallow if desired.   lovastatin 20 MG tablet Commonly known as: MEVACOR Take 20 mg by mouth daily at 6 PM.   metFORMIN 500 MG tablet Commonly known as: GLUCOPHAGE 500 mg 2 (two) times daily with a meal.   pantoprazole 40 MG tablet Commonly known as: PROTONIX   Plavix 75 MG tablet Generic drug: clopidogrel Take 75 mg by mouth.   traMADol 50 MG tablet Commonly known as: ULTRAM Take 75 mg by mouth every 12 (twelve) hours as needed.       Allergies:  Allergies  Allergen Reactions  . Sulfa Antibiotics Rash    Family History: Family History  Problem Relation Age of Onset  . Kidney failure Sister   . Prostate cancer Father   . Ulcers Father   . Diabetes Sister   . Ulcers Sister     Social History:  reports that he has never smoked.  He has never used smokeless tobacco. He reports that he does not drink alcohol or use drugs.   Physical Exam: BP 133/68   Pulse 75   Ht 5\' 5"  (1.651 m)   Wt 172 lb (78 kg)   BMI 28.62 kg/m   Constitutional:  Alert and oriented, No acute distress. HEENT: [place] AT, moist mucus membranes.  Trachea midline, no masses. Cardiovascular: No clubbing, cyanosis, or edema. Respiratory: Normal respiratory effort, no increased work of breathing. Skin: No rashes, bruises or suspicious lesions. Neurologic: Grossly intact, no focal deficits, moving all 4 extremities. Psychiatric: Normal mood and affect.  Assessment & Plan:    1. History of prostate cancer  PSA stabilized which is reassuring  F/u with PSA in 1 year since PSA stable   2. Overactive Bladder Poorly controlled diabetes, A1c 7.9 Pt complains of getting to the bathroom on time Given Myrbetriq 25 mg daily, # 28 samples; I have  notified the patient to call back with medication response and if successful, f/u with Carlis Stable for symptom recheck/ PVR if elects to continue this medication  Return in about 1 year (around 06/27/2020) for PSa or sooner as above.   [facility] [address] [place], [place] [address] [phone]  I, Lucas Mallow, am acting as a scribe for Dr. [name],  I have reviewed the above documentation for accuracy and completeness, and I agree with the above.   [name], MD

## 2019-06-27 NOTE — Addendum Note (Signed)
Addended by: Memory Argue H on: 06/27/2019 11:30 AM   Modules accepted: Orders

## 2019-06-28 ENCOUNTER — Ambulatory Visit: Payer: Medicare Other | Admitting: Urology

## 2019-06-28 ENCOUNTER — Encounter: Payer: Self-pay | Admitting: Urology

## 2019-06-28 VITALS — BP 133/68 | HR 75 | Ht 65.0 in | Wt 172.0 lb

## 2019-06-28 DIAGNOSIS — N3941 Urge incontinence: Secondary | ICD-10-CM | POA: Diagnosis not present

## 2019-06-28 DIAGNOSIS — Z8546 Personal history of malignant neoplasm of prostate: Secondary | ICD-10-CM

## 2020-06-26 ENCOUNTER — Ambulatory Visit: Payer: Medicare Other | Admitting: Urology

## 2020-07-03 ENCOUNTER — Ambulatory Visit: Payer: Medicare Other | Admitting: Urology

## 2020-08-21 ENCOUNTER — Encounter: Payer: Self-pay | Admitting: Urology

## 2020-08-21 ENCOUNTER — Other Ambulatory Visit
Admission: RE | Admit: 2020-08-21 | Discharge: 2020-08-21 | Disposition: A | Discharge status: Home / Self Care | Payer: Medicare Other | Attending: Urology | Admitting: Urology

## 2020-08-21 ENCOUNTER — Other Ambulatory Visit: Payer: Self-pay

## 2020-08-21 ENCOUNTER — Ambulatory Visit: Payer: Medicare Other | Admitting: Urology

## 2020-08-21 VITALS — BP 138/71 | HR 72 | Ht 69.0 in | Wt 169.0 lb

## 2020-08-21 DIAGNOSIS — N3941 Urge incontinence: Secondary | ICD-10-CM | POA: Diagnosis not present

## 2020-08-21 DIAGNOSIS — Z8546 Personal history of malignant neoplasm of prostate: Secondary | ICD-10-CM

## 2020-08-21 LAB — PSA: Prostatic Specific Antigen: 3.64 ng/mL (ref 0.00–4.00)

## 2020-08-21 MED ORDER — MIRABEGRON ER 25 MG PO TB24: 25.0000 mg | ORAL_TABLET | Freq: Every day | ORAL | 0 refills | Status: DC

## 2020-08-21 NOTE — Progress Notes (Signed)
08/21/2020 9:40 AM   [name] [birth year] [id number]  Referring provider: Sallee Lange, NP [address] [place],  [place] [address]  Chief Complaint  Patient presents with  . Follow-up    1 year follow-up    HPI: 86 year old male who returns today for routine annual follow-up of prostate cancer and urinary symptoms.  He was diagnosed with clinical stage T1c, Gleason 3+4 adenocarcinoma the prostate in September 2007 and completed IMRT in 2008. He does not recall ever receiving ADT.  Over the recent past, his PSA has been slowly trending upwards but over the past several years seems to have stabilized.  PSA trend as below.  Unfortunately, he did not go get his PSA prior to the appointment has not had a PSA since last year.  He will get 1 today.  He also has a personal history of urinary urgency frequency and occasional urge incontinence.  He only gets up once to void at nighttime.  At last visit, he was given samples of Myrbetriq to try as this was becoming increasingly bothersome to him.  He does not remember ever getting the samples or if it was effective or not.  He is bothered enough to want to try these medications again.  He denies any dysuria or gross hematuria.  He is on no other BPH medications.  Recent PSA trend: 0.7 2014 0.9 10/2013 1.4 10/2014 1.5 10/2015 1.8 10/2016 2.1 10/2017 2.6 10/2018 2.46 06/27/2019  PMH: Past Medical History:  Diagnosis Date  . Acid reflux   . Diabetes mellitus without complication ([place])   . Gout   . Heart disease   . Heart disease   . Hyperlipidemia   . Hypertension   . Hypertension   . Prostate cancer [facility])     Surgical History: Past Surgical History:  Procedure Laterality Date  . CARDIAC SURGERY    . JOINT REPLACEMENT      Home Medications:  Allergies as of 08/21/2020      Reactions   Sulfa Antibiotics Rash      Medication List       Accurate as of August 21, 2020  9:40 AM. If you have any  questions, ask your nurse or doctor.        amLODipine 5 MG tablet Commonly known as: NORVASC Take 5 mg by mouth.   aspirin EC 81 MG tablet Take 81 mg by mouth.   atenolol 25 MG tablet Commonly known as: TENORMIN Take 25 mg by mouth.   benzonatate 100 MG capsule Commonly known as: TESSALON Take 1 capsule (100 mg total) by mouth 3 (three) times daily as needed.   clopidogrel 75 MG tablet Commonly known as: PLAVIX Take 75 mg by mouth.   glimepiride 2 MG tablet Commonly known as: AMARYL Take 2 mg by mouth.   lidocaine 2 % solution Commonly known as: XYLOCAINE Gargle 15 mL every 3 hours as needed. May swallow if desired.   lovastatin 20 MG tablet Commonly known as: MEVACOR Take 20 mg by mouth daily at 6 PM.   metFORMIN 500 MG tablet Commonly known as: GLUCOPHAGE 500 mg 2 (two) times daily with a meal.   pantoprazole 40 MG tablet Commonly known as: PROTONIX   traMADol 50 MG tablet Commonly known as: ULTRAM Take 75 mg by mouth every 12 (twelve) hours as needed.       Allergies:  Allergies  Allergen Reactions  . Sulfa Antibiotics Rash    Family History: Family History  Problem Relation Age of Onset  . Kidney failure Sister   . Prostate cancer Father   . Ulcers Father   . Diabetes Sister   . Ulcers Sister     Social History:  reports that he has never smoked. He has never used smokeless tobacco. He reports that he does not drink alcohol and does not use drugs.   Physical Exam: BP 138/71   Pulse 72   Ht 5\' 9"  (1.753 m)   Wt 169 lb (76.7 kg)   BMI 24.96 kg/m   Constitutional:  Alert and oriented, No acute distress. HEENT: [place] AT, moist mucus membranes.  Trachea midline, no masses. Cardiovascular: No clubbing, cyanosis, or edema. Respiratory: Normal respiratory effort, no increased work of breathing. Skin: No rashes, bruises or suspicious lesions. Neurologic: Grossly intact, no focal deficits, moving all 4 extremities. Psychiatric: Normal mood and  affect.   Assessment & Plan:    1. History of prostate cancer Personal history of prostate cancer with slowly upward trending PSA  PSA repeated today, will call with results  Given his age and additional comorbidities, will likely manage this very conservatively, he is agreeable this plan - PSA; Future  2. 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

## 2020-09-07 ENCOUNTER — Telehealth: Payer: Self-pay | Admitting: Urology

## 2020-09-07 NOTE — Telephone Encounter (Signed)
Patient is calling and asking for a prescription for myrbetriq to be called into his pharmacy. We gave him samples in the past.  Thanks, Sharyn Lull

## 2020-09-11 ENCOUNTER — Other Ambulatory Visit: Payer: Self-pay

## 2020-09-11 ENCOUNTER — Ambulatory Visit (INDEPENDENT_AMBULATORY_CARE_PROVIDER_SITE_OTHER): Payer: Medicare Other

## 2020-09-11 DIAGNOSIS — N3941 Urge incontinence: Secondary | ICD-10-CM | POA: Diagnosis not present

## 2020-09-11 MED ORDER — MIRABEGRON ER 25 MG PO TB24: 25.0000 mg | ORAL_TABLET | Freq: Every day | ORAL | 11 refills | Status: DC

## 2020-09-11 NOTE — Telephone Encounter (Signed)
Patient called and left a message on the triage line requesting a refill of Myrbetriq, stating samples are working well for him. Per Dr. Cherrie Gauze note if samples work well refill will be fine but a PVR will need to be done prior to refill to ensure he is emptying well. Contacted patient and made a nurse visit for today to check a PVR prior to refill

## 2020-09-11 NOTE — Progress Notes (Signed)
Patient present for PVR today 1 month post starting Myrbetriq per Dr. Cherrie Gauze office note.  Bladder Scan Patient can void: 0 ml Performed By: Fonnie Jarvis, CMA  PVR was excellent Myrbetriq script was sent into his pharmacy.

## 2021-01-21 ENCOUNTER — Telehealth: Payer: Self-pay

## 2021-01-21 MED ORDER — MIRABEGRON ER 50 MG PO TB24: 50.0000 mg | ORAL_TABLET | Freq: Every day | ORAL | 0 refills | Status: DC

## 2021-01-21 NOTE — Telephone Encounter (Signed)
Pt LM on triage line requesting call back.   Called pt back he states he was recently seen by his PCP and was advised to call us to question if he can increase Myrbetriq to 50mg  as 25mg  has not been working well for him. Please advise.

## 2021-01-21 NOTE — Telephone Encounter (Signed)
Left VM with patient to return my call to discuss further.

## 2021-01-21 NOTE — Telephone Encounter (Signed)
Yes, may increase dose to 50 mg.  Please send script and let patient know.  If in 1 month he still isn't seeing much improvement, please have him schedule sooner follow up with myself or PA.  [name], MD

## 2021-01-21 NOTE — Telephone Encounter (Signed)
Sw pt. Pt verbalized understanding. Pt asked for medication to be [facility] to [facility]. Medication sent.

## 2021-03-24 ENCOUNTER — Other Ambulatory Visit: Payer: Self-pay | Admitting: Urology

## 2021-08-18 ENCOUNTER — Other Ambulatory Visit: Payer: Self-pay

## 2021-08-18 DIAGNOSIS — Z8546 Personal history of malignant neoplasm of prostate: Secondary | ICD-10-CM

## 2021-08-19 ENCOUNTER — Other Ambulatory Visit
Admission: RE | Admit: 2021-08-19 | Discharge: 2021-08-19 | Disposition: A | Discharge status: Home / Self Care | Payer: Medicare Other | Attending: Urology | Admitting: Urology

## 2021-08-19 DIAGNOSIS — Z8546 Personal history of malignant neoplasm of prostate: Secondary | ICD-10-CM | POA: Insufficient documentation

## 2021-08-19 LAB — PSA: Prostatic Specific Antigen: 4.52 ng/mL — ABNORMAL HIGH (ref 0.00–4.00)

## 2021-08-19 NOTE — Progress Notes (Signed)
? ?08/20/2021 ?1:16 PM  ? ?Justin Greene ?[birth year] ?[id number] ? ?Referring provider:  ?Sallee Lange, NP ?[address] ?[place],  [place] [address] ?Chief Complaint  ?Patient presents with  ? Prostate Cancer  ?  1 year w/PSA  ? ? ? ?HPI: ?Justin Greene is a 87 y.o.male with a personal history of prostate cancer and urge incontinence of urine who presents today for a 1 year follow-up with PSA prior.  ? ?He was diagnosed with clinical stage T1c, Gleason 3+4 adenocarcinoma the prostate in September 2007 and completed IMRT in 2008. He does not recall ever receiving ADT. ? ?Over the recent past, his PSA has been slowly trending upwards but over the past several years seems to have stabilized.  PSA trend as below. ? ?His most recent PSA was 4.52 on 08/19/2021.  ? ?He reports today that he is still having urinary frequency. He only gets up one to void at nighttime. He also reports urinary urgency.   No dysuria or gross hematuria. ? ?Recent PSA trend: ?0.7  2014 ?0.9  10/2013 ?1.4  10/2014 ?1.5  10/2015 ?1.8  10/2016 ?2.1  10/2017 ?2.6  10/2018 ?2.46 06/27/2019 ?3.64 08/21/2020 ?4.52 08/19/2021 ? ?PMH: ?Past Medical History:  ?Diagnosis Date  ? Acid reflux   ? Diabetes mellitus without complication ([place])   ? Gout   ? Heart disease   ? Heart disease   ? Hyperlipidemia   ? Hypertension   ? Hypertension   ? Prostate cancer ([place])   ? ? ?Surgical History: ?Past Surgical History:  ?Procedure Laterality Date  ? CARDIAC SURGERY    ? JOINT REPLACEMENT    ? ? ?Home Medications:  ?Allergies as of 08/20/2021   ? ?   Reactions  ? Sulfa Antibiotics Rash  ? ?  ? ?  ?Medication List  ?  ? ?  ? Accurate as of August 20, 2021  1:16 PM. If you have any questions, ask your nurse or doctor.  ?  ?  ? ?  ? ?STOP taking these medications   ? ?benzonatate 100 MG capsule ?Commonly known as: TESSALON ?Stopped by: [name], MD ?  ?Myrbetriq 50 MG Tb24 tablet ?Generic drug: mirabegron ER ?Stopped by: [name], MD ?  ? ?  ? ?TAKE  these medications   ? ?amLODipine 5 MG tablet ?Commonly known as: NORVASC ?Take 5 mg by mouth. ?  ?aspirin EC 81 MG tablet ?Take 81 mg by mouth. ?  ?atenolol 25 MG tablet ?Commonly known as: TENORMIN ?Take 25 mg by mouth. ?  ?clopidogrel 75 MG tablet ?Commonly known as: PLAVIX ?Take 75 mg by mouth. ?  ?gabapentin 300 MG capsule ?Commonly known as: NEURONTIN ?Take 300 mg by mouth daily. ?  ?glimepiride 2 MG tablet ?Commonly known as: AMARYL ?Take 2 mg by mouth. ?  ?lidocaine 2 % solution ?Commonly known as: XYLOCAINE ?Gargle 15 mL every 3 hours as needed. May swallow if desired. ?  ?lovastatin 20 MG tablet ?Commonly known as: MEVACOR ?Take 20 mg by mouth daily at 6 PM. ?  ?metFORMIN 500 MG tablet ?Commonly known as: GLUCOPHAGE ?500 mg 2 (two) times daily with a meal. ?  ?pantoprazole 40 MG tablet ?Commonly known as: PROTONIX ?  ?traMADol 50 MG tablet ?Commonly known as: ULTRAM ?Take 75 mg by mouth every 12 (twelve) hours as needed. ?  ?traZODone 50 MG tablet ?Commonly known as: DESYREL ?Take 100 mg by mouth at bedtime as needed. ?  ? ?  ? ? ?  Allergies:  ?Allergies  ?Allergen Reactions  ? Sulfa Antibiotics Rash  ? ? ?Family History: ?Family History  ?Problem Relation Age of Onset  ? Kidney failure Sister   ? Prostate cancer Father   ? Ulcers Father   ? Diabetes Sister   ? Ulcers Sister   ? ? ?Social History:  reports that he has never smoked. He has never used smokeless tobacco. He reports that he does not drink alcohol and does not use drugs. ? ? ?Physical Exam: ?BP (!) 149/83   Pulse 77   Ht '5\' 6"'$  (1.676 m)   Wt 166 lb (75.3 kg)   BMI 26.79 kg/m?   ?Constitutional:  Alert and oriented, No acute distress. ?HEENT: [place] AT, moist mucus membranes.  Trachea midline, no masses. ?Cardiovascular: No clubbing, cyanosis, or edema. ?Respiratory: Normal respiratory effort, no increased work of breathing. ?Skin: No rashes, bruises or suspicious lesions. ?Neurologic: Grossly intact, no focal deficits, moving all 4  extremities. ?Psychiatric: Normal mood and affect. ? ? ?Assessment & Plan:   ? ?1. History of prostate cancer ?- Personal history of prostate cancer  ?- His PSA is slowly upward trending likely probable recurrence  ?- In light of his age and comorbidity discussed recommendation of conservative management of this with PSMA PET scan to see if there is localized recurrence.  ?-We will have him return to continue this discussion pending the PET scan results ? ?2. Urge incontinence of urine ?- Symptoms are bothersome to him and he would like to try a medication. ?- He was given a trial of Gemtesa 75 mg, will reassess symptoms at next visit ?- Will monitor with UA at next visit to rule ou any underlying infection that may be exapserating his symptoms.  ? ?Follow-up in 1 month with UA/PET scan, reassess urinary symptoms ? ?Conley Rolls as a scribe for [name], MD.,have documented all relevant documentation on the behalf of [name], MD,as directed by  [name], MD while in the presence of [name], MD. ? ?I have reviewed the above documentation for accuracy and completeness, and I agree with the above.  ? ?[name], MD ? ? ?[facility] ?[address], [address] ?[place], [place] [address] ?[phone] ?

## 2021-08-19 NOTE — Addendum Note (Signed)
Addended by: Darrick Grinder on: 08/19/2021 11:53 AM ? ? Modules accepted: Orders ? ?

## 2021-08-20 ENCOUNTER — Ambulatory Visit (INDEPENDENT_AMBULATORY_CARE_PROVIDER_SITE_OTHER): Payer: Medicare Other | Admitting: Urology

## 2021-08-20 ENCOUNTER — Encounter: Payer: Self-pay | Admitting: Urology

## 2021-08-20 VITALS — BP 149/83 | HR 77 | Ht 66.0 in | Wt 166.0 lb

## 2021-08-20 DIAGNOSIS — N3941 Urge incontinence: Secondary | ICD-10-CM | POA: Diagnosis not present

## 2021-08-20 DIAGNOSIS — Z8546 Personal history of malignant neoplasm of prostate: Secondary | ICD-10-CM

## 2021-09-07 ENCOUNTER — Ambulatory Visit
Admission: RE | Admit: 2021-09-07 | Discharge: 2021-09-07 | Disposition: A | Discharge status: Home / Self Care | Payer: Medicare Other | Source: Ambulatory Visit | Attending: Urology | Admitting: Urology

## 2021-09-07 DIAGNOSIS — R9721 Rising PSA following treatment for malignant neoplasm of prostate: Secondary | ICD-10-CM | POA: Diagnosis present

## 2021-09-07 DIAGNOSIS — Z8546 Personal history of malignant neoplasm of prostate: Secondary | ICD-10-CM | POA: Diagnosis not present

## 2021-09-07 MED ORDER — PIFLIFOLASTAT F 18 (PYLARIFY) INJECTION
9.0000 | Freq: Once | INTRAVENOUS | Status: AC
  Administered 2021-09-07: 9.84 via INTRAVENOUS

## 2021-09-09 ENCOUNTER — Other Ambulatory Visit
Admission: RE | Admit: 2021-09-09 | Discharge: 2021-09-09 | Disposition: A | Discharge status: Home / Self Care | Payer: Medicare Other | Attending: Urology | Admitting: Urology

## 2021-09-09 DIAGNOSIS — Z8546 Personal history of malignant neoplasm of prostate: Secondary | ICD-10-CM | POA: Insufficient documentation

## 2021-09-09 LAB — URINALYSIS, COMPLETE (UACMP) WITH MICROSCOPIC
Glucose, UA: 100 mg/dL — AB
Hgb urine dipstick: NEGATIVE
Leukocytes,Ua: NEGATIVE
Nitrite: NEGATIVE
RBC / HPF: NONE SEEN RBC/hpf (ref 0–5)
Specific Gravity, Urine: 1.025 (ref 1.005–1.030)
WBC, UA: NONE SEEN WBC/hpf (ref 0–5)
pH: 5.5 (ref 5.0–8.0)

## 2021-09-10 ENCOUNTER — Encounter: Payer: Self-pay | Admitting: Urology

## 2021-09-10 ENCOUNTER — Ambulatory Visit (INDEPENDENT_AMBULATORY_CARE_PROVIDER_SITE_OTHER): Payer: Medicare Other | Admitting: Urology

## 2021-09-10 VITALS — BP 163/60 | HR 73 | Ht 66.0 in | Wt 168.0 lb

## 2021-09-10 DIAGNOSIS — Z8546 Personal history of malignant neoplasm of prostate: Secondary | ICD-10-CM | POA: Diagnosis not present

## 2021-09-10 DIAGNOSIS — N3941 Urge incontinence: Secondary | ICD-10-CM

## 2021-09-10 LAB — BLADDER SCAN AMB NON-IMAGING

## 2021-09-10 MED ORDER — GEMTESA 75 MG PO TABS: 75.0000 mg | ORAL_TABLET | Freq: Every day | ORAL | 3 refills | Status: DC

## 2021-09-10 NOTE — Progress Notes (Signed)
09/10/21 9:34 AM   Justin Greene 08/11/1934 [id number]  Referring provider:  Sallee Lange, NP [address] [place],  [place] [address] Chief Complaint  Patient presents with   Prostate Cancer    1 month follow up     HPI: Justin Greene is a 87 y.o.malewith a personal history of prostate cancer and urge incontinence of urine who presents today for a 1 month follow-up with PET scan results and UA.   He was diagnosed with clinical stage T1c, Gleason 3+4 adenocarcinoma of the prostate in 12/2005 and he completed IMRT in 2008.   Over the recent past, his PSA has been slowly trending upwards but over the past several years seems to have stabilized.  PSA trend as below.   His most recent PSA was 4.52  08/19/2021.  He underwent a PSMA PET scan on 09/07/2021 that visualized focal  activity the apical region of the prostate gland. Findings could represent local recurrence. No evidence metastatic adenopathy in the pelvis or periaortic retroperitoneum.No evidence of visceral metastasis or skeletal metastasis.  He does mention today that the [place] has worked extremely well.  He is no longer having urgency frequency.  He feels like something is bladder well.  Bladder scan today minimal.  He is accompanied today by his family members x2.   PSA trend: 0.7  2014 0.9  10/2013 1.4  10/2014 1.5  10/2015 1.8  10/2016 2.1  10/2017 2.6  10/2018 2.46 06/27/2019 3.64 08/21/2020 4.52 08/19/2021    PMH: Past Medical History:  Diagnosis Date   Acid reflux    Diabetes mellitus without complication ([place])    Gout    Heart disease    Heart disease    Hyperlipidemia    Hypertension    Hypertension    Prostate cancer [facility])     Surgical History: Past Surgical History:  Procedure Laterality Date   CARDIAC SURGERY     JOINT REPLACEMENT      Home Medications:  Allergies as of 09/10/2021       Reactions   Sulfa Antibiotics Rash        Medication List         Accurate as of Sep 10, 2021 11:59 PM. If you have any questions, ask your nurse or doctor.          amLODipine 5 MG tablet Commonly known as: NORVASC Take 5 mg by mouth.   aspirin EC 81 MG tablet Take 81 mg by mouth.   atenolol 25 MG tablet Commonly known as: TENORMIN Take 25 mg by mouth.   clopidogrel 75 MG tablet Commonly known as: PLAVIX Take 75 mg by mouth.   gabapentin 300 MG capsule Commonly known as: NEURONTIN Take 300 mg by mouth daily.   Gemtesa 75 MG Tabs Generic drug: Vibegron Take 75 mg by mouth daily. Started by: Justin Espy, MD   glimepiride 2 MG tablet Commonly known as: AMARYL Take 2 mg by mouth.   lidocaine 2 % solution Commonly known as: XYLOCAINE Gargle 15 mL every 3 hours as needed. May swallow if desired.   lovastatin 20 MG tablet Commonly known as: MEVACOR Take 20 mg by mouth daily at 6 PM.   metFORMIN 500 MG tablet Commonly known as: GLUCOPHAGE 500 mg 2 (two) times daily with a meal.   pantoprazole 40 MG tablet Commonly known as: PROTONIX   traMADol 50 MG tablet Commonly known as: ULTRAM Take 75 mg by mouth every 12 (twelve) hours as  needed.   traZODone 50 MG tablet Commonly known as: DESYREL Take 100 mg by mouth at bedtime as needed.        Allergies:  Allergies  Allergen Reactions   Sulfa Antibiotics Rash    Family History: Family History  Problem Relation Age of Onset   Kidney failure Sister    Prostate cancer Father    Ulcers Father    Diabetes Sister    Ulcers Sister     Social History:  reports that he has never smoked. He has never used smokeless tobacco. He reports that he does not drink alcohol and does not use drugs.   Physical Exam: BP (!) 163/60   Pulse 73   Ht '5\' 6"'$  (1.676 m)   Wt 168 lb (76.2 kg)   BMI 27.12 kg/m   Constitutional:  Alert and oriented, No acute distress. HEENT: [place] AT, moist mucus membranes.  Trachea midline, no masses. Cardiovascular: No clubbing, cyanosis, or  edema. Respiratory: Normal respiratory effort, no increased work of breathing. Skin: No rashes, bruises or suspicious lesions. Neurologic: Grossly intact, no focal deficits, moving all 4 extremities. Psychiatric: Normal mood and affect.  Laboratory Data: Urinalysis:  Component     Latest Ref Rng 09/09/2021  Color, Urine     YELLOW  YELLOW   Appearance     CLEAR  HAZY !   Specific Gravity, Urine     1.005 - 1.030  1.025   pH     5.0 - 8.0  5.5   Glucose, UA     NEGATIVE mg/dL 100 !   Hgb urine dipstick     NEGATIVE  NEGATIVE   Bilirubin Urine     NEGATIVE  SMALL !   Ketones, ur     NEGATIVE mg/dL TRACE !   Protein     NEGATIVE mg/dL TRACE !   Nitrite     NEGATIVE  NEGATIVE   Leukocytes,Ua     NEGATIVE  NEGATIVE   Squamous Epithelial / LPF     0 - 5  0-5   WBC, UA     0 - 5 WBC/hpf NONE SEEN   RBC / HPF     0 - 5 RBC/hpf NONE SEEN   Bacteria, UA     NONE SEEN  RARE !   Mucus PRESENT     ! Abnormal  Pertinent Imaging: CLINICAL DATA:  Prostate carcinoma with biochemical recurrence. Gleason 7 adenocarcinoma diagnosed 2007. Post high IMRT. Rising PSA   EXAM: NUCLEAR MEDICINE PET SKULL BASE TO THIGH   TECHNIQUE: 9.9 mCi F18 Piflufolastat (Pylarify) was injected intravenously. Full-ring PET imaging was performed from the skull base to thigh after the radiotracer. CT data was obtained and used for attenuation correction and anatomic localization.   COMPARISON:  None Available.   FINDINGS: NECK   No radiotracer activity in neck lymph nodes.   Incidental CT finding: None   CHEST   No radiotracer accumulation within mediastinal or hilar lymph nodes. No suspicious pulmonary nodules on the CT scan.   Incidental CT finding: None   ABDOMEN/PELVIS   Prostate: focus radiotracer activity just RIGHT of midline towards the apex of the prostate gland centrally with SUV max equal 12.6. On sagittal projection, this activity appears to be anterior to the expected  course of the urethra.   Lymph nodes: No abnormal radiotracer accumulation within pelvic or abdominal nodes.   Liver: No evidence of liver metastasis   Incidental CT finding: None   SKELETON  No focal  activity to suggest skeletal metastasis.   IMPRESSION: 1. Focal activity the apical region of the prostate gland as described above. Findings could represent local recurrence. 2. No evidence metastatic adenopathy in the pelvis or periaortic retroperitoneum. 3. No evidence of visceral metastasis or skeletal metastasis.     Electronically Signed   By: Suzy Bouchard M.D.   On: 09/08/2021 16:15  I have personally reviewed the images and agree with radiologist interpretation.   Results for orders placed or performed in visit on 09/10/21  BLADDER SCAN AMB NON-IMAGING  Result Value Ref Range   Scan Result 1m     Assessment & Plan:    Prostate Cancer  - Non-metastatic localized recurrence with very slow doubling time ; recommend continued watchful waiting after lengthy shared decision making with the patient and his family -Alternatives including initiation of palliative ADT discussed, in light of lack of symptoms as well as very slow doubling time, would hold off on this, he and his family are agreeable with this plan -We did discuss warning symptoms including worsening of urinary symptoms and/or bone pain, failure to thrive, weight loss is indications for urgent reevaluation - Discussed importance of bone health, recommend 1000-1200 mg daily calcium suppliment and [phone] IU vit D daily.  Also encouraged weight being exercises and cardiovascular health. - Will recheck PSA in 1 year   2. Urge incontinence of urine  - Symptom have improved on Gemtesa trial  -He is not a candidate for anticholinergics given his age and concern for cognitive decline.  As such, only candidate for beta 3 agonist   F/u 1 year with PSA  IConley Rollsas a scribe for AHollice Espy  MD.,have documented all relevant documentation on the behalf of AHollice Espy MD,as directed by  AHollice Espy MD while in the presence of AHollice Greene M[place] [place] [phone]

## 2021-09-22 ENCOUNTER — Telehealth: Payer: Self-pay | Admitting: Family Medicine

## 2021-09-22 NOTE — Telephone Encounter (Signed)
Patient was given samples of Gemtesa at his last visit and it did help with the urinary symptoms. However he had side effects of nausea and diarrhea. He stopped the medication and the side effects have gone away but the urinary symptoms are back. He is wanting to know if there is another medication he may be able to try. Please advise

## 2021-09-28 NOTE — Telephone Encounter (Signed)
Other to alternative medications would be Myrbetriq 50 mg or [place].  I would have cognitive concerns with all other anticholinergic medications.  [name], MD

## 2021-09-28 NOTE — Telephone Encounter (Signed)
Patient informed voiced Justin Greene was been taking Gemtesa with improvement in his urinary symptoms. He was taking Gemtesa for two weeks when he experienced nausea and diarrhea for one day. His symptoms have improved. Advised per Dr. Cherrie Gauze recommendation patient will try Gemtesa and discontinue if symptoms worsen.

## 2022-01-18 ENCOUNTER — Encounter: Payer: Self-pay | Admitting: *Deleted

## 2022-01-18 ENCOUNTER — Observation Stay
Admission: EM | Admit: 2022-01-18 | Discharge: 2022-01-19 | Disposition: A | Discharge status: Home / Self Care | Payer: Medicare Other | Attending: Internal Medicine | Admitting: Internal Medicine

## 2022-01-18 ENCOUNTER — Emergency Department: Payer: Medicare Other

## 2022-01-18 ENCOUNTER — Other Ambulatory Visit: Payer: Self-pay

## 2022-01-18 DIAGNOSIS — I739 Peripheral vascular disease, unspecified: Secondary | ICD-10-CM | POA: Diagnosis present

## 2022-01-18 DIAGNOSIS — Z951 Presence of aortocoronary bypass graft: Secondary | ICD-10-CM | POA: Diagnosis not present

## 2022-01-18 DIAGNOSIS — A4189 Other specified sepsis: Secondary | ICD-10-CM | POA: Diagnosis not present

## 2022-01-18 DIAGNOSIS — R059 Cough, unspecified: Secondary | ICD-10-CM

## 2022-01-18 DIAGNOSIS — I110 Hypertensive heart disease with heart failure: Secondary | ICD-10-CM | POA: Insufficient documentation

## 2022-01-18 DIAGNOSIS — U071 COVID-19: Secondary | ICD-10-CM | POA: Diagnosis not present

## 2022-01-18 DIAGNOSIS — Y92002 Bathroom of unspecified non-institutional (private) residence single-family (private) house as the place of occurrence of the external cause: Secondary | ICD-10-CM | POA: Insufficient documentation

## 2022-01-18 DIAGNOSIS — R2689 Other abnormalities of gait and mobility: Secondary | ICD-10-CM | POA: Insufficient documentation

## 2022-01-18 DIAGNOSIS — E1151 Type 2 diabetes mellitus with diabetic peripheral angiopathy without gangrene: Secondary | ICD-10-CM | POA: Insufficient documentation

## 2022-01-18 DIAGNOSIS — I2510 Atherosclerotic heart disease of native coronary artery without angina pectoris: Secondary | ICD-10-CM | POA: Insufficient documentation

## 2022-01-18 DIAGNOSIS — E119 Type 2 diabetes mellitus without complications: Secondary | ICD-10-CM

## 2022-01-18 DIAGNOSIS — Z79899 Other long term (current) drug therapy: Secondary | ICD-10-CM | POA: Diagnosis not present

## 2022-01-18 DIAGNOSIS — Z955 Presence of coronary angioplasty implant and graft: Secondary | ICD-10-CM | POA: Diagnosis not present

## 2022-01-18 DIAGNOSIS — Z8546 Personal history of malignant neoplasm of prostate: Secondary | ICD-10-CM | POA: Insufficient documentation

## 2022-01-18 DIAGNOSIS — Z7902 Long term (current) use of antithrombotics/antiplatelets: Secondary | ICD-10-CM | POA: Insufficient documentation

## 2022-01-18 DIAGNOSIS — R531 Weakness: Secondary | ICD-10-CM | POA: Diagnosis present

## 2022-01-18 DIAGNOSIS — Z7984 Long term (current) use of oral hypoglycemic drugs: Secondary | ICD-10-CM | POA: Insufficient documentation

## 2022-01-18 DIAGNOSIS — W010XXA Fall on same level from slipping, tripping and stumbling without subsequent striking against object, initial encounter: Secondary | ICD-10-CM | POA: Insufficient documentation

## 2022-01-18 DIAGNOSIS — I429 Cardiomyopathy, unspecified: Secondary | ICD-10-CM

## 2022-01-18 DIAGNOSIS — A419 Sepsis, unspecified organism: Principal | ICD-10-CM | POA: Insufficient documentation

## 2022-01-18 DIAGNOSIS — I5020 Unspecified systolic (congestive) heart failure: Secondary | ICD-10-CM | POA: Diagnosis not present

## 2022-01-18 DIAGNOSIS — Z9660 Presence of unspecified orthopedic joint implant: Secondary | ICD-10-CM | POA: Insufficient documentation

## 2022-01-18 DIAGNOSIS — Z7982 Long term (current) use of aspirin: Secondary | ICD-10-CM | POA: Diagnosis not present

## 2022-01-18 DIAGNOSIS — I10 Essential (primary) hypertension: Secondary | ICD-10-CM | POA: Diagnosis present

## 2022-01-18 DIAGNOSIS — F1190 Opioid use, unspecified, uncomplicated: Secondary | ICD-10-CM | POA: Insufficient documentation

## 2022-01-18 LAB — HEPATIC FUNCTION PANEL
ALT: 17 U/L (ref 0–44)
AST: 26 U/L (ref 15–41)
Albumin: 3.8 g/dL (ref 3.5–5.0)
Alkaline Phosphatase: 68 U/L (ref 38–126)
Bilirubin, Direct: 0.1 mg/dL (ref 0.0–0.2)
Indirect Bilirubin: 0.8 mg/dL (ref 0.3–0.9)
Total Bilirubin: 0.9 mg/dL (ref 0.3–1.2)
Total Protein: 7.5 g/dL (ref 6.5–8.1)

## 2022-01-18 LAB — URINALYSIS, ROUTINE W REFLEX MICROSCOPIC
Bilirubin Urine: NEGATIVE
Glucose, UA: NEGATIVE mg/dL
Hgb urine dipstick: NEGATIVE
Ketones, ur: NEGATIVE mg/dL
Leukocytes,Ua: NEGATIVE
Nitrite: NEGATIVE
Protein, ur: NEGATIVE mg/dL
Specific Gravity, Urine: 1.004 — ABNORMAL LOW (ref 1.005–1.030)
pH: 7 (ref 5.0–8.0)

## 2022-01-18 LAB — BASIC METABOLIC PANEL
Anion gap: 7 (ref 5–15)
BUN: 15 mg/dL (ref 8–23)
CO2: 23 mmol/L (ref 22–32)
Calcium: 8.7 mg/dL — ABNORMAL LOW (ref 8.9–10.3)
Chloride: 107 mmol/L (ref 98–111)
Creatinine, Ser: 1.28 mg/dL — ABNORMAL HIGH (ref 0.61–1.24)
GFR, Estimated: 55 mL/min — ABNORMAL LOW (ref >60)
Glucose, Bld: 121 mg/dL — ABNORMAL HIGH (ref 70–99)
Potassium: 3.5 mmol/L (ref 3.5–5.1)
Sodium: 137 mmol/L (ref 135–145)

## 2022-01-18 LAB — CBG MONITORING, ED
Glucose-Capillary: 73 mg/dL (ref 70–99)
Glucose-Capillary: 145 mg/dL — ABNORMAL HIGH (ref 70–99)

## 2022-01-18 LAB — GLUCOSE, CAPILLARY: Glucose-Capillary: 141 mg/dL — ABNORMAL HIGH (ref 70–99)

## 2022-01-18 LAB — HEMOGLOBIN A1C
Hgb A1c MFr Bld: 6.9 % — ABNORMAL HIGH (ref 4.8–5.6)
Mean Plasma Glucose: 151.33 mg/dL

## 2022-01-18 LAB — CBC
HCT: 37.7 % — ABNORMAL LOW (ref 39.0–52.0)
Hemoglobin: 12.4 g/dL — ABNORMAL LOW (ref 13.0–17.0)
MCH: 28.8 pg (ref 26.0–34.0)
MCHC: 32.9 g/dL (ref 30.0–36.0)
MCV: 87.7 fL (ref 80.0–100.0)
Platelets: 209 10*3/uL (ref 150–400)
RBC: 4.3 MIL/uL (ref 4.22–5.81)
RDW: 14.7 % (ref 11.5–15.5)
WBC: 10.7 10*3/uL — ABNORMAL HIGH (ref 4.0–10.5)
nRBC: 0 % (ref 0.0–0.2)

## 2022-01-18 LAB — RESP PANEL BY RT-PCR (FLU A&B, COVID) ARPGX2
Influenza A by PCR: NEGATIVE
Influenza B by PCR: NEGATIVE
SARS Coronavirus 2 by RT PCR: POSITIVE — AB

## 2022-01-18 LAB — LACTIC ACID, PLASMA: Lactic Acid, Venous: 1.1 mmol/L (ref 0.5–1.9)

## 2022-01-18 LAB — CK: Total CK: 267 U/L (ref 49–397)

## 2022-01-18 LAB — PROTIME-INR
INR: 1.1 (ref 0.8–1.2)
Prothrombin Time: 14.5 seconds (ref 11.4–15.2)

## 2022-01-18 LAB — TROPONIN I (HIGH SENSITIVITY): Troponin I (High Sensitivity): 15 ng/L (ref <18)

## 2022-01-18 MED ORDER — GABAPENTIN 100 MG PO CAPS
100.0000 mg | ORAL_CAPSULE | Freq: Every day | ORAL | Status: DC
  Administered 2022-01-18 – 2022-01-19 (×2): 100 mg via ORAL
  Filled 2022-01-18 (×2): qty 1

## 2022-01-18 MED ORDER — ACETAMINOPHEN 325 MG PO TABS
650.0000 mg | ORAL_TABLET | Freq: Four times a day (QID) | ORAL | Status: DC | PRN
  Administered 2022-01-18: 650 mg via ORAL
  Filled 2022-01-18: qty 2

## 2022-01-18 MED ORDER — INSULIN ASPART 100 UNIT/ML IJ SOLN: 0.0000 [IU] | Freq: Three times a day (TID) | INTRAMUSCULAR | Status: DC

## 2022-01-18 MED ORDER — ASPIRIN 81 MG PO TBEC
81.0000 mg | DELAYED_RELEASE_TABLET | Freq: Every day | ORAL | Status: DC
  Administered 2022-01-18 – 2022-01-19 (×2): 81 mg via ORAL
  Filled 2022-01-18 (×2): qty 1

## 2022-01-18 MED ORDER — SODIUM CHLORIDE 0.9 % IV SOLN
2.0000 g | Freq: Once | INTRAVENOUS | Status: AC
  Administered 2022-01-18: 2 g via INTRAVENOUS
  Filled 2022-01-18: qty 12.5

## 2022-01-18 MED ORDER — MIRABEGRON ER 25 MG PO TB24
25.0000 mg | ORAL_TABLET | Freq: Every day | ORAL | Status: DC
  Administered 2022-01-18 – 2022-01-19 (×2): 25 mg via ORAL
  Filled 2022-01-18 (×2): qty 1

## 2022-01-18 MED ORDER — INFLUENZA VAC A&B SA ADJ QUAD 0.5 ML IM PRSY
0.5000 mL | PREFILLED_SYRINGE | INTRAMUSCULAR | Status: DC
  Filled 2022-01-18: qty 0.5

## 2022-01-18 MED ORDER — ACETAMINOPHEN 325 MG PO TABS: 650.0000 mg | ORAL_TABLET | Freq: Once | ORAL | Status: DC

## 2022-01-18 MED ORDER — ATENOLOL 25 MG PO TABS
25.0000 mg | ORAL_TABLET | Freq: Every day | ORAL | Status: DC
  Administered 2022-01-18 – 2022-01-19 (×2): 25 mg via ORAL
  Filled 2022-01-18 (×2): qty 1

## 2022-01-18 MED ORDER — CLOPIDOGREL BISULFATE 75 MG PO TABS
75.0000 mg | ORAL_TABLET | Freq: Every day | ORAL | Status: DC
  Administered 2022-01-18 – 2022-01-19 (×2): 75 mg via ORAL
  Filled 2022-01-18 (×2): qty 1

## 2022-01-18 MED ORDER — INSULIN ASPART 100 UNIT/ML IJ SOLN: 0.0000 [IU] | Freq: Every day | INTRAMUSCULAR | Status: DC

## 2022-01-18 MED ORDER — NIRMATRELVIR/RITONAVIR (PAXLOVID) TABLET (RENAL DOSING)
2.0000 | ORAL_TABLET | Freq: Two times a day (BID) | ORAL | Status: DC
  Administered 2022-01-18 – 2022-01-19 (×3): 2 via ORAL
  Filled 2022-01-18: qty 20

## 2022-01-18 MED ORDER — ENOXAPARIN SODIUM 40 MG/0.4ML IJ SOSY
40.0000 mg | PREFILLED_SYRINGE | INTRAMUSCULAR | Status: DC
  Administered 2022-01-18: 40 mg via SUBCUTANEOUS
  Filled 2022-01-18: qty 0.4

## 2022-01-18 MED ORDER — LACTATED RINGERS IV BOLUS
2000.0000 mL | Freq: Once | INTRAVENOUS | Status: AC
  Administered 2022-01-18: 2000 mL via INTRAVENOUS

## 2022-01-18 MED ORDER — MENTHOL 3 MG MT LOZG
1.0000 | LOZENGE | OROMUCOSAL | Status: DC | PRN
  Administered 2022-01-18: 3 mg via ORAL
  Filled 2022-01-18: qty 9

## 2022-01-18 MED ORDER — VANCOMYCIN HCL 1750 MG/350ML IV SOLN
1750.0000 mg | Freq: Once | INTRAVENOUS | Status: AC
  Administered 2022-01-18: 1750 mg via INTRAVENOUS
  Filled 2022-01-18: qty 350

## 2022-01-18 MED ORDER — TRAMADOL HCL 50 MG PO TABS: 75.0000 mg | ORAL_TABLET | Freq: Two times a day (BID) | ORAL | Status: DC | PRN

## 2022-01-18 NOTE — Progress Notes (Signed)
   01/18/22 1811  Assess: MEWS Score  Temp (!) 101.8 F (38.8 C)  BP (!) 172/79  MAP (mmHg) 101  Pulse Rate (!) 57  Resp 18  SpO2 99 %  O2 Device Room Air  Assess: MEWS Score  MEWS Temp 2  MEWS Systolic 0  MEWS Pulse 0  MEWS RR 0  MEWS LOC 0  MEWS Score 2  MEWS Score Color Yellow  Assess: if the MEWS score is Yellow or Red  Were vital signs taken at a resting state? Yes  Focused Assessment No change from prior assessment  Does the patient meet 2 or more of the SIRS criteria? No  MEWS guidelines implemented *See Row Information* Yes  Take Vital Signs  Increase Vital Sign Frequency  Yellow: Q 2hr X 2 then Q 4hr X 2, if remains yellow, continue Q 4hrs  Escalate  MEWS: Escalate Yellow: discuss with charge nurse/RN and consider discussing with provider and RRT  Notify: Charge Nurse/RN  Name of Charge Nurse/RN Notified [email or web address]  Date Charge Nurse/RN Notified 01/18/22  Time Charge Nurse/RN Notified 1818  Notify: Provider  Provider Name/Title wouk  Date Provider Notified 01/18/22  Time Provider Notified 1818  Method of Notification Page (secure chat)  Notification Reason Critical result  Provider response See new orders (tylenol PRN)  Date of Provider Response 01/18/22  Time of Provider Response 1821  Notify: Rapid Response  Name of Rapid Response RN Notified \  Assess: SIRS CRITERIA  SIRS Temperature  1  SIRS Pulse 0  SIRS Respirations  0  SIRS WBC 1  SIRS Score Sum  2     Patient arrived from ED with a temp of 101.8. MD notified and PRN tylenol was ordered.

## 2022-01-18 NOTE — ED Notes (Signed)
Pt brought in via ems from home.  Pt found by family tonight on the bathroom floor.  Pt reports dizziness today.  No chest pain or sob.  Pt has a cough and fever.  Iv in place   pt alert   speech clear.

## 2022-01-18 NOTE — ED Triage Notes (Signed)
Pt brought in via ems from home.  Pt was found on the bathroom floor.  Pt has a fever.  Tylenol given by ems.  Pt has iv lfa.  Pt has a cough.  Pt reports dizziness earlier today.  Pt alert  speech clear.  Fsbs 130 per ems.

## 2022-01-18 NOTE — H&P (Addendum)
History and Physical    Justin Greene GNO:[id number] DOB: 1934-08-09 DOA: 01/18/2022  PCP: Sallee Lange, NP  Patient coming from: home   Chief Complaint: weakness  HPI: Justin Greene is a 87 y.o. male with medical history significant for cad, dm, pad, opioid use, who presents with the above.  Lives at home with son, ambulates independently at baseline. Reports went to church revival this past weekend. Yesterday began feeling weak and fatigued with subjective fevers, also cough and sore throat. Yesterday evening went to toilet, felt light headed and slumped off the toilet, denies trauma or LOC. No dyspnea. No vomiting or diarrhea. No dysuria. No chest pain.  ED Course:   Covid positive. Given vanc/cefepime, 2 L  Review of Systems: As per HPI otherwise 10 point review of systems negative.    Past Medical History:  Diagnosis Date   Acid reflux    Diabetes mellitus without complication ([place])    Gout    Heart disease    Heart disease    Hyperlipidemia    Hypertension    Hypertension    Prostate cancer [facility])     Past Surgical History:  Procedure Laterality Date   CARDIAC SURGERY     JOINT REPLACEMENT       reports that he has never smoked. He has never used smokeless tobacco. He reports that he does not drink alcohol and does not use drugs.  Allergies  Allergen Reactions   Sulfa Antibiotics Rash    Family History  Problem Relation Age of Onset   Kidney failure Sister    Prostate cancer Father    Ulcers Father    Diabetes Sister    Ulcers Sister     Prior to Admission medications   Medication Sig Start Date End Date Taking? Authorizing Provider  amLODipine (NORVASC) 5 MG tablet Take 5 mg by mouth daily. 02/08/12  Yes [provider]  aspirin EC 81 MG tablet Take 81 mg by mouth daily. 02/08/12  Yes [provider]  atenolol (TENORMIN) 25 MG tablet Take 25 mg by mouth daily. 02/08/12  Yes [provider]   clopidogrel (PLAVIX) 75 MG tablet Take 75 mg by mouth daily. 02/08/12  Yes [provider]  diclofenac Sodium (VOLTAREN) 1 % GEL Apply 2 g topically 4 (four) times daily. 11/04/21 11/04/22 Yes [provider]  gabapentin (NEURONTIN) 300 MG capsule Take 300 mg by mouth daily. 06/08/21  Yes [provider]  glimepiride (AMARYL) 4 MG tablet Take 4 mg by mouth 2 (two) times daily. 12/29/21  Yes [provider]  lovastatin (MEVACOR) 20 MG tablet Take 20 mg by mouth daily at 6 PM.   Yes [provider]  metFORMIN (GLUCOPHAGE) 500 MG tablet 500 mg 2 (two) times daily with a meal.  11/20/14  Yes [provider]  pantoprazole (PROTONIX) 40 MG tablet Take 40 mg by mouth daily. 07/24/12  Yes [provider]  triamcinolone cream (KENALOG) 0.5 % Apply 1 Application topically 2 (two) times daily. 12/14/21  Yes [provider]  Vibegron (GEMTESA) 75 MG TABS Take 75 mg by mouth daily. 09/10/21  Yes [name], MD  lidocaine (XYLOCAINE) 2 % solution Gargle 15 mL every 3 hours as needed. May swallow if desired. Patient not taking: Reported on 01/18/2022 03/29/18   Coral Spikes, DO  traMADol (ULTRAM) 50 MG tablet Take 75 mg by mouth every 12 (twelve) hours as needed.    [provider]  traZODone (DESYREL)  50 MG tablet Take 100 mg by mouth at bedtime as needed. 03/11/21   [provider]    Physical Exam: Vitals:   01/18/22 0336 01/18/22 0400 01/18/22 0433 01/18/22 0740  BP: (!) 129/54 (!) 128/52  (!) 133/57  Pulse: 80 69  70  Resp: _0 Temp:   98.6 F (37 C) 99 F (37.2 C)  TempSrc:   Oral Oral  SpO2: 99% 99%  99%  Weight:      Height:        Constitutional: No acute distress Head: Atraumatic Eyes: Conjunctiva clear ENM: Moist mucous membranes. Normal dentition.  Neck: Supple Respiratory: Clear to auscultation bilaterally, no wheezing/rales/rhonchi. Normal respiratory effort. No accessory muscle use.  . Cardiovascular: Regular rate and rhythm. No murmurs/rubs/gallops. Abdomen: Non-tender, non-distended. No masses. No rebound or guarding. Positive bowel sounds. Musculoskeletal: No joint deformity upper and lower extremities. Normal ROM, no contractures. Normal muscle tone.  Skin: No rashes, lesions, or ulcers.  Extremities: No peripheral edema. Palpable peripheral pulses. Neurologic: Alert, moving all 4 extremities. Psychiatric: Normal insight and judgement.   Labs on Admission: I have personally reviewed following labs and imaging studies  CBC: Recent Labs  Lab 01/18/22 0128  WBC 10.7*  HGB 12.4*  HCT 37.7*  MCV 87.7  PLT 920   Basic Metabolic Panel: Recent Labs  Lab 01/18/22 0128  NA 137  K 3.5  CL 107  CO2 23  GLUCOSE 121*  BUN 15  CREATININE 1.28*  CALCIUM 8.7*   GFR: Estimated Creatinine Clearance: 38.1 mL/min (A) (by C-G formula based on SCr of 1.28 mg/dL (H)). Liver Function Tests: Recent Labs  Lab 01/18/22 0128  AST 26  ALT 17  ALKPHOS 68  BILITOT 0.9  PROT 7.5  ALBUMIN 3.8   No results for input(s): "LIPASE", "AMYLASE" in the last 168 hours. No results for input(s): "AMMONIA" in the last 168 hours. Coagulation Profile: Recent Labs  Lab 01/18/22 0128  INR 1.1   Cardiac Enzymes: Recent Labs  Lab 01/18/22 0128  CKTOTAL 267   BNP (last 3 results) No results for input(s): "PROBNP" in the last 8760 hours. HbA1C: No results for input(s): "HGBA1C" in the last 72 hours. CBG: No results for input(s): "GLUCAP" in the last 168 hours. Lipid Profile: No results for input(s): "CHOL", "HDL", "LDLCALC", "TRIG", "CHOLHDL", "LDLDIRECT" in the last 72 hours. Thyroid Function Tests: No results for input(s): "TSH", "T4TOTAL", "FREET4", "T3FREE", "THYROIDAB" in the last 72 hours. Anemia Panel: No results for input(s): "VITAMINB12", "FOLATE", "FERRITIN", "TIBC", "IRON", "RETICCTPCT" in the last 72 hours. Urine analysis:    Component Value Date/Time    COLORURINE COLORLESS (A) 01/18/2022 0431   APPEARANCEUR CLEAR (A) 01/18/2022 0431   APPEARANCEUR Clear 11/21/2014 1218   LABSPEC 1.004 (L) 01/18/2022 0431   PHURINE 7.0 01/18/2022 0431   GLUCOSEU NEGATIVE 01/18/2022 0431   HGBUR NEGATIVE 01/18/2022 0431   BILIRUBINUR NEGATIVE 01/18/2022 0431   BILIRUBINUR Negative 11/21/2014 1218   KETONESUR NEGATIVE 01/18/2022 0431   PROTEINUR NEGATIVE 01/18/2022 0431   NITRITE NEGATIVE 01/18/2022 0431   LEUKOCYTESUR NEGATIVE 01/18/2022 0431    Radiological Exams on Admission: CT Head Wo Contrast  Result Date: 01/18/2022 CLINICAL DATA:  Head trauma, minor (Age >= 65y).  Found down. EXAM: CT HEAD WITHOUT CONTRAST TECHNIQUE: Contiguous axial images were obtained from the base of the skull through the vertex without intravenous contrast. RADIATION DOSE REDUCTION: This exam was performed according to the departmental dose-optimization program which includes automated exposure control,  adjustment of the mA and/or kV according to patient size and/or use of iterative reconstruction technique. COMPARISON:  None Available. FINDINGS: Brain: No acute intracranial abnormality. Specifically, no hemorrhage, hydrocephalus, mass lesion, acute infarction, or significant intracranial injury. Physiologic calcifications in the basal ganglia. Vascular: No hyperdense vessel or unexpected calcification. Skull: No acute calvarial abnormality. Sinuses/Orbits: No acute findings Other: None IMPRESSION: No acute intracranial abnormality. Electronically Signed   By: Rolm Baptise M.D.   On: 01/18/2022 02:07   DG Chest Port 1 View  Result Date: 01/18/2022 CLINICAL DATA:  Shortness of breath EXAM: PORTABLE CHEST 1 VIEW COMPARISON:  None Available. FINDINGS: Prior CABG. Heart is normal size. No confluent airspace opacities or effusions. No acute bony abnormality. IMPRESSION: No active disease. Electronically Signed   By: Rolm Baptise M.D.   On: 01/18/2022 01:59    EKG: Independently  reviewed. Sinus tachycardia  Assessment/Plan Principal Problem:   Sepsis due to COVID-19 [facility]) Active Problems:   Cardiomyopathy ([place])   Diabetes mellitus type 2, uncomplicated ([place])   Essential (primary) hypertension   Peripheral vascular disease ([place])    # Sepsis 2/2 covid infection. By fever, tachycardia, leukocytosis. Treated with vanc/cefepime and 2 L fluids. Sepsis physiology resolved - hold fluids - po ad lib  # Covid-19 infection No dyspnea, o2 requirement, or focal infiltrate on cxr - will ask pharmacy to run interaction checker and assuming safe to do so will then start paxlovid - holding on steroids as no o2 requirement  # Weakness # Presyncope No focal neurologic signs/symptoms. Likely 2/2 covid infection. Ct head negative - pt/ot consults  # T2DM Here euglycemic - hold home met/glimep - SSI  # CAD # Cardiomyopathy S/p PCI, CABG. Asymptomatic, compensated - cont home atenolol, aspirin, plavix - holding statin in anticipation of paxlovid  # Chronic pain - cont home tramadol, reduced dose home gabapentin given weakness  # HTN Here bp wnl - hold home amlodipine for now - cont home atenolol  DVT prophylaxis: lovenox Code Status: DNR  Family Communication: son updated telephonically 9/26 Consults called: none  Level of care: Telemetry Medical Status is: Inpatient, ordered by Dr. Sidney Ace     Desma Maxim MD [facility] Pager [phone]  If 7PM-7AM, please contact night-coverage [email or web address] Password [facility]  01/18/2022, 11:43 AM

## 2022-01-18 NOTE — ED Provider Notes (Signed)
[facility] Provider Note    Event Date/Time   First MD Initiated Contact with Patient 01/18/22 0119     (approximate)   History   Fall   HPI  Justin Greene is a 87 y.o. male   Past medical history of diabetes, hyperlipidemia, hypertension who presents with 2 days of cough, 1 day of fever and generalized weakness, and a fall sustained while trying to get to the bathroom today.    Him started with a mild cough 2 days ago, nonproductive.  Then he had a fever this morning, subjective fever with unknown Tmax.  When he got up to go to the bathroom had some incontinence, with a history of urge incontinence per his urology notes dated May 2023, during which he felt generalized weakness and had a fall denies presyncopal symptoms but also does not recall the mechanism of his fall but did not pass out and did not strike his head and he is not on blood thinners.  He had difficulty getting up due to generalized weakness.  He was not on the ground very long, per patient.  History was obtained via the patient and review of external medical notes.      Physical Exam   Triage Vital Signs: ED Triage Vitals  Enc Vitals Group     BP 01/18/22 0125 (!) 155/54     Pulse Rate 01/18/22 0125 (!) 120     Resp 01/18/22 0125 (!) 22     Temp 01/18/22 0125 (!) 100.6 F (38.1 C)     Temp Source 01/18/22 0125 Oral     SpO2 01/18/22 0125 96 %     Weight 01/18/22 0122 167 lb 8.8 oz (76 kg)     Height 01/18/22 0122 '5\' 6"'$  (1.676 m)     Head Circumference --      Peak Flow --      Pain Score 01/18/22 0122 0     Pain Loc --      Pain Edu? --      Excl. in [place]? --     Most recent vital signs: Vitals:   01/18/22 0253 01/18/22 0336  BP:  (!) 129/54  Pulse:  80  Resp: 20 14  Temp: 99.1 F (37.3 C)   SpO2:  99%    General: Awake, no distress.  Alert and oriented, pleasant and conversant CV:  mucus membranes are slightly dry Resp:  Tachypnea 22 at triage, no focality  or wheezing Abd:  No distention.  None tender to palpation Other:  Febrile 100.6, tachycardic 120 and tachypneic 22 on triage vitals.  Hypertensive 150s over 50.   ED Results / Procedures / Treatments   Labs (all labs ordered are listed, but only abnormal results are displayed) Labs Reviewed  RESP PANEL BY RT-PCR (FLU A&B, COVID) ARPGX2 - Abnormal; Notable for the following components:      Result Value   SARS Coronavirus 2 by RT PCR POSITIVE (*)    All other components within normal limits  BASIC METABOLIC PANEL - Abnormal; Notable for the following components:   Glucose, Bld 121 (*)    Creatinine, Ser 1.28 (*)    Calcium 8.7 (*)    GFR, Estimated 55 (*)    All other components within normal limits  CBC - Abnormal; Notable for the following components:   WBC 10.7 (*)    Hemoglobin 12.4 (*)    HCT 37.7 (*)    All other components within normal  limits  CULTURE, BLOOD (ROUTINE X 2)  CULTURE, BLOOD (ROUTINE X 2)  URINE CULTURE  LACTIC ACID, PLASMA  PROTIME-INR  CK  HEPATIC FUNCTION PANEL  LACTIC ACID, PLASMA  APTT  URINALYSIS, ROUTINE W REFLEX MICROSCOPIC  TROPONIN I (HIGH SENSITIVITY)  TROPONIN I (HIGH SENSITIVITY)     I reviewed labs and they are notable for fattening of 1.28, mild leukocytosis white blood cell count 10.7  EKG  ED ECG REPORT I, Lucillie Garfinkel, the attending physician, personally viewed and interpreted this ECG.   Date: 01/18/2022  EKG Time: 0135  Rate: 129  Rhythm: sinus tachycardia, frequent PVC's noted  Axis: normal  Intervals:qtc 464  ST&T Change: no stemi     RADIOLOGY I independently reviewed and interpreted CT head and see no obvious hemorrhage or midline shift   PROCEDURES:  Critical Care performed: Yes, see critical care procedure note(s)  .Critical Care  Performed by: Lucillie Garfinkel, MD Authorized by: Lucillie Garfinkel, MD   Critical care provider statement:    Critical care time (minutes):  30   Critical care was necessary to treat  or prevent imminent or life-threatening deterioration of the following conditions:  Sepsis   Critical care was time spent personally by me on the following activities:  Development of treatment plan with patient or surrogate, ordering and review of radiographic studies, ordering and review of laboratory studies, ordering and performing treatments and interventions, re-evaluation of patient's condition, evaluation of patient's response to treatment and examination of patient    MEDICATIONS ORDERED IN ED: Medications  vancomycin (VANCOREADY) IVPB 1750 mg/350 mL (1,750 mg Intravenous New Bag/Given 01/18/22 0212)  lactated ringers bolus 2,000 mL (2,000 mLs Intravenous New Bag/Given 01/18/22 0206)  ceFEPIme (MAXIPIME) 2 g in sodium chloride 0.9 % 100 mL IVPB (0 g Intravenous Stopped 01/18/22 0336)    Consultants:  I spoke with hospitalist re: admission and regarding care plan for this patient.   IMPRESSION / MDM / ASSESSMENT AND PLAN / ED COURSE  I reviewed the triage vital signs and the nursing notes.                              Differential diagnosis includes, but is not limited to, sepsis, respiratory infection, urinary tract infection, syncope, metabolic derangement or AKI, intracranial bleeding, other traumatic injury from fall.   The patient is on the cardiac monitor to evaluate for evidence of arrhythmia and/or significant heart rate changes.  MDM: Sepsis evaluation initiated upon my assessment of the patient for tachycardia and fever, tachypnea.  Broad-spectrum antibiotics vancomycin and cefepime ordered along with a sepsis bolus cc per kilogram of IV lactated Ringer's.  Blood cultures, urinalysis, chest x-ray, lactic acid and viral swabs to assess for source of infection, most likely respiratory given preceding cough.  Tachycardia with frequent PVCs likely driven by his underlying infection, assess for troponins though if elevated likely demand ischemia.  3:48 AM COVID+, VS improved.   No hypoxia on RA.  Admit  Patient's presentation is most consistent with acute presentation with potential threat to life or bodily function.       FINAL CLINICAL IMPRESSION(S) / ED DIAGNOSES   Final diagnoses:  COVID  Cough with fever     Rx / DC Orders   ED Discharge Orders     None        Note:  This document was prepared using Dragon voice recognition software and may include unintentional dictation errors.  Lucillie Garfinkel, MD 01/18/22 [phone]

## 2022-01-18 NOTE — Progress Notes (Signed)
PHARMACY -  BRIEF ANTIBIOTIC NOTE   Pharmacy has received consult(s) for Vancomycin from an ED provider.  The patient's profile has been reviewed for ht/wt/allergies/indication/available labs.    One time order(s) placed for Vancomycin 1750 mg IV X 1  Further antibiotics/pharmacy consults should be ordered by admitting physician if indicated.                       Thank you, Aleksandra Raben D 01/18/2022  1:47 AM

## 2022-01-19 DIAGNOSIS — U071 COVID-19: Secondary | ICD-10-CM | POA: Diagnosis not present

## 2022-01-19 DIAGNOSIS — E119 Type 2 diabetes mellitus without complications: Secondary | ICD-10-CM

## 2022-01-19 DIAGNOSIS — I739 Peripheral vascular disease, unspecified: Secondary | ICD-10-CM

## 2022-01-19 DIAGNOSIS — I10 Essential (primary) hypertension: Secondary | ICD-10-CM | POA: Diagnosis not present

## 2022-01-19 LAB — URINE CULTURE: Culture: NO GROWTH

## 2022-01-19 LAB — BASIC METABOLIC PANEL
Anion gap: 9 (ref 5–15)
BUN: 14 mg/dL (ref 8–23)
CO2: 23 mmol/L (ref 22–32)
Calcium: 8.8 mg/dL — ABNORMAL LOW (ref 8.9–10.3)
Chloride: 106 mmol/L (ref 98–111)
Creatinine, Ser: 1.07 mg/dL (ref 0.61–1.24)
GFR, Estimated: >60 mL/min (ref >60)
Glucose, Bld: 121 mg/dL — ABNORMAL HIGH (ref 70–99)
Potassium: 3.5 mmol/L (ref 3.5–5.1)
Sodium: 138 mmol/L (ref 135–145)

## 2022-01-19 LAB — GLUCOSE, CAPILLARY
Glucose-Capillary: 112 mg/dL — ABNORMAL HIGH (ref 70–99)
Glucose-Capillary: 150 mg/dL — ABNORMAL HIGH (ref 70–99)

## 2022-01-19 LAB — CBC
HCT: 38.4 % — ABNORMAL LOW (ref 39.0–52.0)
Hemoglobin: 12.8 g/dL — ABNORMAL LOW (ref 13.0–17.0)
MCH: 28.7 pg (ref 26.0–34.0)
MCHC: 33.3 g/dL (ref 30.0–36.0)
MCV: 86.1 fL (ref 80.0–100.0)
Platelets: 194 10*3/uL (ref 150–400)
RBC: 4.46 MIL/uL (ref 4.22–5.81)
RDW: 14.8 % (ref 11.5–15.5)
WBC: 7.5 10*3/uL (ref 4.0–10.5)
nRBC: 0 % (ref 0.0–0.2)

## 2022-01-19 LAB — C-REACTIVE PROTEIN: CRP: 6.8 mg/dL — ABNORMAL HIGH (ref <1.0)

## 2022-01-19 MED ORDER — NIRMATRELVIR/RITONAVIR (PAXLOVID) TABLET (RENAL DOSING): 2.0000 | ORAL_TABLET | Freq: Two times a day (BID) | ORAL | 0 refills | Status: AC

## 2022-01-19 NOTE — Assessment & Plan Note (Signed)
Continue aspirin and Plavix

## 2022-01-19 NOTE — Progress Notes (Signed)
Discharge instructions reviewed with patient. PIVs removed with no complications. No further questions or concerns at this time. Patient states he is [place] home via car with his cousin

## 2022-01-19 NOTE — Discharge Summary (Signed)
Physician Discharge Summary   Patient: Justin Greene MRN: [id number] DOB: [birth year]  Admit date:     01/18/2022  Discharge date: 01/19/22  Discharge Physician: Annita Brod   PCP: Sallee Lange, NP   Recommendations at discharge:   New medication: Paxlovid twice daily x4 more days Patient advised to isolate for the next 9 days While patient is on Paxlovid, advised to hold his lovastatin and which can be resumed after completion of Paxlovid  Discharge Diagnoses: Principal Problem:   Sepsis due to COVID-19 [facility]) Active Problems:   Cardiomyopathy ([place])   Diabetes mellitus type 2, uncomplicated ([place])   Essential (primary) hypertension   Peripheral vascular disease ([place])  Resolved Problems:   * No resolved hospital problems. *  Hospital Course: 87 year old male with past medical history of diabetes mellitus, CAD, mild systolic heart failure with ejection fraction of 45% and hypertension admitted for sepsis secondary to [place].  Assessment and Plan: * Sepsis due to COVID-19 [facility]) Met criteria for sepsis on admission given tachycardia, tachypnea, temperature greater than 100.5 and COVID infection.  Does not appear to be hypoxic.  By following day, patient breathing comfortably and able to ambulate without difficulty.  Was started on Paxlovid.  Given lack of hypoxia and CRP level only at 6, no reason to place on steroids.  Seen by PT and cleared and so felt to be stable for discharge home.  Peripheral vascular disease (HCC) Continue aspirin and Plavix  Essential (primary) hypertension Continue home medications.  Diabetes mellitus type 2, uncomplicated ([place]) CBG stable  Cardiomyopathy ([place]) Continue home medications          Consultants: None Procedures performed: None Disposition: Home Diet recommendation:  Discharge Diet Orders (From admission, onward)     Start     Ordered   01/19/22 0000  Diet - low sodium heart healthy        01/19/22 1149            Carb modified DISCHARGE MEDICATION: Allergies as of 01/19/2022       Reactions   Sulfa Antibiotics Rash        Medication List     STOP taking these medications    lovastatin 20 MG tablet Commonly known as: MEVACOR       TAKE these medications    amLODipine 5 MG tablet Commonly known as: NORVASC Take 5 mg by mouth daily.   aspirin EC 81 MG tablet Take 81 mg by mouth daily.   atenolol 25 MG tablet Commonly known as: TENORMIN Take 25 mg by mouth daily.   clopidogrel 75 MG tablet Commonly known as: PLAVIX Take 75 mg by mouth daily.   diclofenac Sodium 1 % Gel Commonly known as: VOLTAREN Apply 2 g topically 4 (four) times daily.   gabapentin 300 MG capsule Commonly known as: NEURONTIN Take 300 mg by mouth daily.   Gemtesa 75 MG Tabs Generic drug: Vibegron Take 75 mg by mouth daily.   glimepiride 4 MG tablet Commonly known as: AMARYL Take 4 mg by mouth 2 (two) times daily.   metFORMIN 500 MG tablet Commonly known as: GLUCOPHAGE 500 mg 2 (two) times daily with a meal.   nirmatrelvir/ritonavir EUA (renal dosing) 10 x 150 MG & 10 x $Re'100MG'ShM$  Tabs Commonly known as: PAXLOVID Take 2 tablets by mouth 2 (two) times daily for 5 days. Patient GFR is >60. Take nirmatrelvir (150 mg) one tablet twice daily for 5 days and ritonavir (100 mg) one tablet twice daily  for 5 days.   pantoprazole 40 MG tablet Commonly known as: PROTONIX Take 40 mg by mouth daily.   traMADol 50 MG tablet Commonly known as: ULTRAM Take 75 mg by mouth every 12 (twelve) hours as needed.   traZODone 50 MG tablet Commonly known as: DESYREL Take 100 mg by mouth at bedtime as needed.   triamcinolone cream 0.5 % Commonly known as: KENALOG Apply 1 Application topically 2 (two) times daily.        Discharge Exam: Filed Weights   01/18/22 0122  Weight: 76 kg   General: Alert and oriented x3, no acute distress Cardiovascular: Regular rate and rhythm, S1-S2 Lungs: Clear  to auscultation bilaterally  Condition at discharge: good  The results of significant diagnostics from this hospitalization (including imaging, microbiology, ancillary and laboratory) are listed below for reference.   Imaging Studies: CT Head Wo Contrast  Result Date: 01/18/2022 CLINICAL DATA:  Head trauma, minor (Age >= 65y).  Found down. EXAM: CT HEAD WITHOUT CONTRAST TECHNIQUE: Contiguous axial images were obtained from the base of the skull through the vertex without intravenous contrast. RADIATION DOSE REDUCTION: This exam was performed according to the departmental dose-optimization program which includes automated exposure control, adjustment of the mA and/or kV according to patient size and/or use of iterative reconstruction technique. COMPARISON:  None Available. FINDINGS: Brain: No acute intracranial abnormality. Specifically, no hemorrhage, hydrocephalus, mass lesion, acute infarction, or significant intracranial injury. Physiologic calcifications in the basal ganglia. Vascular: No hyperdense vessel or unexpected calcification. Skull: No acute calvarial abnormality. Sinuses/Orbits: No acute findings Other: None IMPRESSION: No acute intracranial abnormality. Electronically Signed   By: Rolm Baptise M.D.   On: 01/18/2022 02:07   DG Chest Port 1 View  Result Date: 01/18/2022 CLINICAL DATA:  Shortness of breath EXAM: PORTABLE CHEST 1 VIEW COMPARISON:  None Available. FINDINGS: Prior CABG. Heart is normal size. No confluent airspace opacities or effusions. No acute bony abnormality. IMPRESSION: No active disease. Electronically Signed   By: Rolm Baptise M.D.   On: 01/18/2022 01:59    Microbiology: Results for orders placed or performed during the hospital encounter of 01/18/22  Culture, blood (Routine x 2)     Status: None (Preliminary result)   Collection Time: 01/18/22  1:27 AM   Specimen: BLOOD  Result Value Ref Range Status   Specimen Description BLOOD LEFT FOREARM  Final   Special  Requests   Final    BOTTLES DRAWN AEROBIC AND ANAEROBIC Blood Culture results may not be optimal due to an inadequate volume of blood received in culture bottles   Culture   Final    NO GROWTH 1 DAY Performed at [facility], [address] [place], [place] [address]    Report Status PENDING  Incomplete  Culture, blood (Routine x 2)     Status: None (Preliminary result)   Collection Time: 01/18/22  1:27 AM   Specimen: BLOOD  Result Value Ref Range Status   Specimen Description BLOOD RIGHT ASSIST CONTROL  Final   Special Requests   Final    BOTTLES DRAWN AEROBIC AND ANAEROBIC Blood Culture results may not be optimal due to an inadequate volume of blood received in culture bottles   Culture   Final    NO GROWTH 1 DAY Performed at [facility], [address] [place], [place] [address]    Report Status PENDING  Incomplete  Resp Panel by RT-PCR (Flu A&B, Covid) Anterior Nasal Swab     Status: Abnormal  Collection Time: 01/18/22  1:28 AM   Specimen: Anterior Nasal Swab  Result Value Ref Range Status   SARS Coronavirus 2 by RT PCR POSITIVE (A) NEGATIVE Final    Comment: (NOTE) SARS-CoV-2 target nucleic acids are DETECTED.  The SARS-CoV-2 RNA is generally detectable in upper respiratory specimens during the acute phase of infection. Positive results are indicative of the presence of the identified virus, but do not rule out bacterial infection or co-infection with other pathogens not detected by the test. Clinical correlation with patient history and other diagnostic information is necessary to determine patient infection status. The expected result is Negative.  Fact Sheet for Patients: EntrepreneurPulse.com.au  Fact Sheet for Healthcare Providers: IncredibleEmployment.be  This test is not yet approved or cleared by the [place] FDA and  has been authorized for detection and/or diagnosis of SARS-CoV-2 by FDA under an  Emergency Use Authorization (EUA).  This EUA will remain in effect (meaning this test can be used) for the duration of  the COVID-19 declaration under Section 564(b)(1) of the A ct, 21 U.S.C. section 360bbb-3(b)(1), unless the authorization is terminated or revoked sooner.     Influenza A by PCR NEGATIVE NEGATIVE Final   Influenza B by PCR NEGATIVE NEGATIVE Final    Comment: (NOTE) The Xpert Xpress SARS-CoV-2/FLU/RSV plus assay is intended as an aid in the diagnosis of influenza from Nasopharyngeal swab specimens and should not be used as a sole basis for treatment. Nasal washings and aspirates are unacceptable for Xpert Xpress SARS-CoV-2/FLU/RSV testing.  Fact Sheet for Patients: EntrepreneurPulse.com.au  Fact Sheet for Healthcare Providers: IncredibleEmployment.be  This test is not yet approved or cleared by the [place] FDA and has been authorized for detection and/or diagnosis of SARS-CoV-2 by FDA under an Emergency Use Authorization (EUA). This EUA will remain in effect (meaning this test can be used) for the duration of the COVID-19 declaration under Section 564(b)(1) of the Act, 21 U.S.C. section 360bbb-3(b)(1), unless the authorization is terminated or revoked.  Performed at [facility], [address] [place], [place] [address]   Urine Culture     Status: None   Collection Time: 01/18/22  4:31 AM   Specimen: In/Out Cath Urine  Result Value Ref Range Status   Specimen Description   Final    IN/OUT CATH URINE Performed at [facility], [address], [place], [place] [address]    Special Requests   Final    NONE Performed at [facility], [address], [place], [place] [address]    Culture   Final    NO GROWTH Performed at [facility], [place] [address], [place],  [address]    Report Status 01/19/2022 FINAL  Final    Labs: CBC: Recent Labs  Lab 01/18/22 0128  01/19/22 0613  WBC 10.7* 7.5  HGB 12.4* 12.8*  HCT 37.7* 38.4*  MCV 87.7 86.1  PLT 209 511   Basic Metabolic Panel: Recent Labs  Lab 01/18/22 0128 01/19/22 0613  NA 137 138  K 3.5 3.5  CL 107 106  CO2 23 23  GLUCOSE 121* 121*  BUN 15 14  CREATININE 1.28* 1.07  CALCIUM 8.7* 8.8*   Liver Function Tests: Recent Labs  Lab 01/18/22 0128  AST 26  ALT 17  ALKPHOS 68  BILITOT 0.9  PROT 7.5  ALBUMIN 3.8   CBG: Recent Labs  Lab 01/18/22 1220 01/18/22 1628 01/18/22 2033 01/19/22 0752 01/19/22 1126  GLUCAP 73 145* 141* 112* 150*  Discharge time spent: less than 30 minutes.  Signed: Annita Brod, MD [facility] 01/19/2022

## 2022-01-19 NOTE — Care Management Obs Status (Signed)
[place] NOTIFICATION   Patient Details  Name: Tomoki Lucken MRN: [id number] Date of Birth: [birth year]   Medicare Observation Status Notification Given:  Yes    Beverly Sessions, RN 01/19/2022, 2:11 PM

## 2022-01-19 NOTE — Hospital Course (Signed)
87 year old male with past medical history of diabetes mellitus, CAD, mild systolic heart failure with ejection fraction of 45% and hypertension admitted for sepsis secondary to [place].

## 2022-01-19 NOTE — TOC Initial Note (Signed)
Transition of Care [facility]) - Initial/Assessment Note    Patient Details  Name: Justin Greene MRN: [id number] Date of Birth: 1934-04-29  Transition of Care [facility]) CM/SW Contact:    Beverly Sessions, RN Phone Number: 01/19/2022, 1:28 PM  Clinical Narrative:                     Transition of Care [facility]) Screening Note   Patient Details  Name: Justin Greene Date of Birth: May 03, 1934   Transition of Care [facility]) CM/SW Contact:    Beverly Sessions, RN Phone Number: 01/19/2022, 1:28 PM    Transition of Care Department [facility]) has reviewed patient and no TOC needs have been identified at this time. We will continue to monitor patient advancement through interdisciplinary progression rounds. If new patient transition needs arise, please place a TOC consult.  Per PT and OT no follow up at discharge. No DME needs.  Patient on RA     Patient Goals and CMS Choice        Expected Discharge Plan and Services                                                Prior Living Arrangements/Services                       Activities of Daily Living Home Assistive Devices/Equipment: None ADL Screening (condition at time of admission) Patient's cognitive ability adequate to safely complete daily activities?: Yes Is the patient deaf or have difficulty hearing?: No Does the patient have difficulty seeing, even when wearing glasses/contacts?: Yes Does the patient have difficulty concentrating, remembering, or making decisions?: No Patient able to express need for assistance with ADLs?: Yes Does the patient have difficulty dressing or bathing?: Yes Independently performs ADLs?: Yes (appropriate for developmental age) Does the patient have difficulty walking or climbing stairs?: No Weakness of Legs: Both Weakness of Arms/Hands: None  Permission Sought/Granted                  Emotional Assessment              Admission diagnosis:  Cough with fever  [R05.9, R50.9] Sepsis due to COVID-19 ([place]) [U07.1, A41.89] COVID [U07.1] COVID-19 [U07.1] Patient Active Problem List   Diagnosis Date Noted   Sepsis due to COVID-19 ([place]) 01/18/2022   Chronic, continuous use of opioids 01/18/2022   COVID-19 01/18/2022   Mitral regurgitation 01/23/2018   Primary osteoarthritis of right hip 07/01/2015   BP (high blood pressure) 11/21/2014   Diabetes mellitus type 2, uncomplicated ([place]) 28/78/6767   Essential (primary) hypertension 11/04/2013   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 09/19/2013   Cardiomyopathy ([place]) 09/19/2013   Claudication ([place]) 09/19/2013   Acid reflux 09/19/2013   HLD (hyperlipidemia) 09/19/2013   Peripheral vascular disease ([place]) 09/19/2013   Cardiomyopathy, idiopathic ([place]) 09/19/2013   Urge incontinence 10/03/2012   FOM (frequency of micturition) 10/03/2012   Erectile dysfunction 02/08/2012   Prostate cancer ([place]) 02/08/2012   PCP:  Sallee Lange, NP Pharmacy:   [facility]) - [place], [place] [address] Phone: [phone] Fax: [place], [place] [address] AT [facility]  [address] [place] [facility] [place] [address] Phone: [phone] Fax: [phone]     Social Determinants of Health (SDOH) Interventions    Readmission Risk Interventions     No data to display

## 2022-01-19 NOTE — Evaluation (Addendum)
Occupational Therapy Evaluation Patient Details Name: Justin Greene MRN: [id number] DOB: 1934-07-16 Today's Date: 01/19/2022   History of Present Illness Justin Greene is a 87 y.o. male with medical history significant for cad, dm, pad, opioid use, who presents following fall, found to be COVID +   Clinical Impression   Justin Greene was seen for OT evaluation this date. Prior to hospital admission, pt was Independent for mobility and ADLs. Pt lives with son and daughter in law in home c 3 STE. Pt presents to acute OT demonstrating near baseline functional mobility and ADL performance. Pt currently requires mildly increased time only to don B socks seated EOB, SUPERVISION for ADL t/f and functional mobility ~200 ft no AD use. Educated on importance of mobility for functional strengthening, no acute OT needs identified, will sign off.  Upon hospital discharge, recommend no OT follow up.       Recommendations for follow up therapy are one component of a multi-disciplinary discharge planning process, led by the attending physician.  Recommendations may be updated based on patient status, additional functional criteria and insurance authorization.   Follow Up Recommendations  No OT follow up    Assistance Recommended at Discharge None  Patient can return home with the following Help with stairs or ramp for entrance    Functional Status Assessment  Patient has had a recent decline in their functional status and demonstrates the ability to make significant improvements in function in a reasonable and predictable amount of time.  Equipment Recommendations  None recommended by OT    Recommendations for Other Services       Precautions / Restrictions Precautions Precautions: Fall Restrictions Weight Bearing Restrictions: No      Mobility Bed Mobility Overal bed mobility: Independent                  Transfers Overall transfer level: Independent Equipment used:  None                      Balance Overall balance assessment: Mild deficits observed, not formally tested                                         ADL either performed or assessed with clinical judgement   ADL Overall ADL's : At baseline                                       General ADL Comments: Donned B socks seated EOB, mildly increased time. SUPERVISION for ADL t/f and functional mobility ~200 ft      Pertinent Vitals/Pain Pain Assessment Pain Assessment: No/denies pain     Hand Dominance     Extremity/Trunk Assessment Upper Extremity Assessment Upper Extremity Assessment: Overall WFL for tasks assessed   Lower Extremity Assessment Lower Extremity Assessment: Overall WFL for tasks assessed       Communication Communication Communication: No difficulties   Cognition Arousal/Alertness: Awake/alert Behavior During Therapy: WFL for tasks assessed/performed Overall Cognitive Status: Within Functional Limits for tasks assessed                                 General Comments: pleasant and agreeable  Home Living Family/patient expects to be discharged to:: Private residence Living Arrangements: Children Available Help at Discharge: Family;Available PRN/intermittently Type of Home: House Home Access: Stairs to enter Entrance Stairs-Number of Steps: 3                   Home Equipment: Cane - single point          Prior Functioning/Environment Prior Level of Function : Independent/Modified Independent;Driving             Mobility Comments: denies falls other than 1 leading to admission          OT Problem List: Decreased activity tolerance         OT Goals(Current goals can be found in the care plan section) Acute Rehab OT Goals Patient Stated Goal: to go home OT Goal Formulation: With patient Time For Goal Achievement: 02/02/22 Potential to Achieve Goals: Good    AM-PAC OT "6 Clicks" Daily Activity     Outcome Measure Help from another person eating meals?: None Help from another person taking care of personal grooming?: None Help from another person toileting, which includes using toliet, bedpan, or urinal?: None Help from another person bathing (including washing, rinsing, drying)?: A Little Help from another person to put on and taking off regular upper body clothing?: None Help from another person to put on and taking off regular lower body clothing?: None 6 Click Score: 23   End of Session    Activity Tolerance: Patient tolerated treatment well Patient left: in chair;with call bell/phone within reach;with chair alarm set  OT Visit Diagnosis: Other abnormalities of gait and mobility (R26.89)                Time: 2505-3976 OT Time Calculation (min): 15 min Charges:  OT General Charges $OT Visit: 1 Visit OT Evaluation $OT Eval Low Complexity: 1 Low  Justin Greene, M.S. OTR/L  01/19/22, 9:45 AM  ascom [phone]

## 2022-01-19 NOTE — Assessment & Plan Note (Signed)
Met criteria for sepsis on admission given tachycardia, tachypnea, temperature greater than 100.5 and COVID infection.  Does not appear to be hypoxic.  By following day, patient breathing comfortably and able to ambulate without difficulty.  Was started on Paxlovid.  Given lack of hypoxia and CRP level only at 6, no reason to place on steroids.  Seen by PT and cleared and so felt to be stable for discharge home.

## 2022-01-19 NOTE — Assessment & Plan Note (Signed)
CBG stable 

## 2022-01-19 NOTE — Assessment & Plan Note (Signed)
-   Continue home medications 

## 2022-01-19 NOTE — Care Management CC44 (Signed)
Condition Code 44 Documentation Completed  Patient Details  Name: Gayland Nicol MRN: [id number] Date of Birth: 1934/08/29   Condition Code 44 given:  Yes Patient signature on Condition Code 44 notice:  Yes Documentation of 2 MD's agreement:  Yes Code 44 added to claim:  Yes    Beverly Sessions, RN 01/19/2022, 2:14 PM

## 2022-01-19 NOTE — Discharge Instructions (Signed)
Continue the Paxlovid-finishes on 9/30.  While on Paxlovid, DO NOT TAKE your cholesterol medicine. Ok to restart 10/1.

## 2022-01-19 NOTE — Evaluation (Signed)
Physical Therapy Evaluation Patient Details Name: Justin Greene MRN: [id number] DOB: January 19, 1935 Today's Date: 01/19/2022  History of Present Illness  Justin Greene is a 87 y.o. male with medical history significant for cad, dm, pad, opioid use, who presents following fall, found to be COVID +  Clinical Impression  Patient received in bed, OT present. He is independent with bed mobility, transfers with supervision. Ambulated 150 feet without AD and supervision. No lob or difficulties noted or reported. Appears to be at baseline level of mobility. No further PT needs at this time, will sign off.           Recommendations for follow up therapy are one component of a multi-disciplinary discharge planning process, led by the attending physician.  Recommendations may be updated based on patient status, additional functional criteria and insurance authorization.  Follow Up Recommendations No PT follow up      Assistance Recommended at Discharge Frequent or constant Supervision/Assistance  Patient can return home with the following  Help with stairs or ramp for entrance;Assist for transportation;Assistance with cooking/housework    Equipment Recommendations None recommended by PT  Recommendations for Other Services       Functional Status Assessment Patient has not had a recent decline in their functional status     Precautions / Restrictions Precautions Precautions: Fall Restrictions Weight Bearing Restrictions: No      Mobility  Bed Mobility Overal bed mobility: Independent                  Transfers Overall transfer level: Independent Equipment used: None                    Ambulation/Gait Ambulation/Gait assistance: Supervision Gait Distance (Feet): 150 Feet Assistive device: None Gait Pattern/deviations: Step-through pattern, Decreased step length - right, Decreased step length - left, Decreased stride length Gait velocity: decr      General Gait Details: no lob with ambulation. Appears to be and reports to be close to baseline  Science writer    Modified Rankin (Stroke Patients Only)       Balance Overall balance assessment: Mild deficits observed, not formally tested                                           Pertinent Vitals/Pain Pain Assessment Pain Assessment: No/denies pain    Home Living Family/patient expects to be discharged to:: Private residence Living Arrangements: Children Available Help at Discharge: Family;Available PRN/intermittently Type of Home: House Home Access: Stairs to enter   [employer] of Steps: 3   Home Layout: One level Home Equipment: Cane - single point      Prior Function Prior Level of Function : Independent/Modified Independent;Driving             Mobility Comments: denies falls other than 1 leading to admission ADLs Comments: independent at baseline     Hand Dominance        Extremity/Trunk Assessment   Upper Extremity Assessment Upper Extremity Assessment: Defer to OT evaluation    Lower Extremity Assessment Lower Extremity Assessment: Overall WFL for tasks assessed    Cervical / Trunk Assessment Cervical / Trunk Assessment: Normal  Communication   Communication: No difficulties  Cognition Arousal/Alertness: Awake/alert Behavior During Therapy: WFL for tasks assessed/performed Overall Cognitive Status: Within  Functional Limits for tasks assessed                                 General Comments: pleasant and agreeable        General Comments      Exercises     Assessment/Plan    PT Assessment Patient does not need any further PT services  PT Problem List         PT Treatment Interventions Gait training    PT Goals (Current goals can be found in the Care Plan section)  Acute Rehab PT Goals Patient Stated Goal: to return home PT Goal Formulation: With  patient Time For Goal Achievement: 01/22/22 Potential to Achieve Goals: Good    Frequency       Co-evaluation   Reason for Co-Treatment: To address functional/ADL transfers PT goals addressed during session: Mobility/safety with mobility OT goals addressed during session: ADL's and self-care       AM-PAC PT "6 Clicks" Mobility  Outcome Measure Help needed turning from your back to your side while in a flat bed without using bedrails?: None Help needed moving from lying on your back to sitting on the side of a flat bed without using bedrails?: None Help needed moving to and from a bed to a chair (including a wheelchair)?: None Help needed standing up from a chair using your arms (e.g., wheelchair or bedside chair)?: None Help needed to walk in hospital room?: None Help needed climbing 3-5 steps with a railing? : A Little 6 Click Score: 23    End of Session Equipment Utilized During Treatment: Gait belt Activity Tolerance: Patient tolerated treatment well Patient left: in chair;with call bell/phone within reach;with chair alarm set Nurse Communication: Mobility status      Time: 1478-2956 PT Time Calculation (min) (ACUTE ONLY): 10 min   Charges:   PT Evaluation $PT Eval Low Complexity: 1 Low          Justin Greene, PT, GCS 01/19/22,10:39 AM

## 2022-01-23 LAB — CULTURE, BLOOD (ROUTINE X 2)
Culture: NO GROWTH
Culture: NO GROWTH

## 2022-03-31 ENCOUNTER — Ambulatory Visit (LOCAL_COMMUNITY_HEALTH_CENTER): Payer: Medicare Other

## 2022-03-31 DIAGNOSIS — Z23 Encounter for immunization: Secondary | ICD-10-CM | POA: Diagnosis not present

## 2022-03-31 DIAGNOSIS — Z719 Counseling, unspecified: Secondary | ICD-10-CM

## 2022-03-31 NOTE — Progress Notes (Signed)
  Are you feeling sick today? No   Have you ever received a dose of COVID-19 Vaccine? [employer], [place], [place], [place], Other) Yes  If yes, which vaccine and how many doses?    Pfizer 5 doses  Did you bring the vaccination record card or other documentation?  No   Do you have a health condition or are undergoing treatment that makes you moderately or severely immunocompromised? This would include, but not be limited to: cancer, HIV, organ transplant, immunosuppressive therapy/high-dose corticosteroids, or moderate/severe primary immunodeficiency.  No  Have you received COVID-19 vaccine before or during hematopoietic cell transplant (HCT) or CAR-T-cell therapies? No  Have you ever had an allergic reaction to: (This would include a severe allergic reaction or a reaction that caused hives, swelling, or respiratory distress, including wheezing.) A component of a COVID-19 vaccine or a previous dose of COVID-19 vaccine? No   Have you ever had an allergic reaction to another vaccine (other thanCOVID-19 vaccine) or an injectable medication? (This would include a severe allergic reaction or a reaction that caused hives, swelling, or respiratory distress, including wheezing.)   No    Do you have a history of any of the following:  Myocarditis or Pericarditis No  Dermal fillers:  No  Multisystem Inflammatory Syndrome (MIS-C or MIS-A)? No  COVID-19 disease within the past 3 months? No  Vaccinated with monkeypox vaccine in the last 4 weeks? No  Requesting Pfizer vaccine.  Comirnaty 12+ 2023-24 formula given; tolerated well.  VIS and [facility] copy given to pt.  Stayed for 15 min observation and no complaints.    Tonny Branch, RN

## 2022-08-26 ENCOUNTER — Other Ambulatory Visit: Payer: Self-pay | Admitting: Urology

## 2022-08-26 ENCOUNTER — Other Ambulatory Visit
Admission: RE | Admit: 2022-08-26 | Discharge: 2022-08-26 | Disposition: A | Discharge status: Home / Self Care | Payer: 59 | Attending: Urology | Admitting: Urology

## 2022-08-26 DIAGNOSIS — Z8546 Personal history of malignant neoplasm of prostate: Secondary | ICD-10-CM | POA: Insufficient documentation

## 2022-08-26 LAB — PSA: Prostatic Specific Antigen: 7 ng/mL — ABNORMAL HIGH (ref 0.00–4.00)

## 2022-08-26 NOTE — Telephone Encounter (Signed)
Will address at his appointment on 09/02/22-has enough medication until the end of the month per date of last refill

## 2022-09-01 IMAGING — PT NM PET TUM IMG SKULL BASE T - THIGH
7 series · 25 of 25 positions shown · non-contrast
Comparison: None Available.

CLINICAL DATA: Prostate carcinoma with biochemical recurrence.
Gleason 7 adenocarcinoma diagnosed 1113. Post high IMRT. Rising PSA

EXAM:
NUCLEAR MEDICINE PET SKULL BASE TO THIGH
TECHNIQUE: 9.9 mCi F18 Piflufolastat (Pylarify) was injected intravenously.
Full-ring PET imaging was performed from the skull base to thigh
after the radiotracer. CT data was obtained and used for attenuation
correction and anatomic localization.

[Series 2: ct slices · axial · 3.8mm · 1.37mm/px · z∈[-977,-3]mm · 5 of 297 slices shown]
[im 1/297]
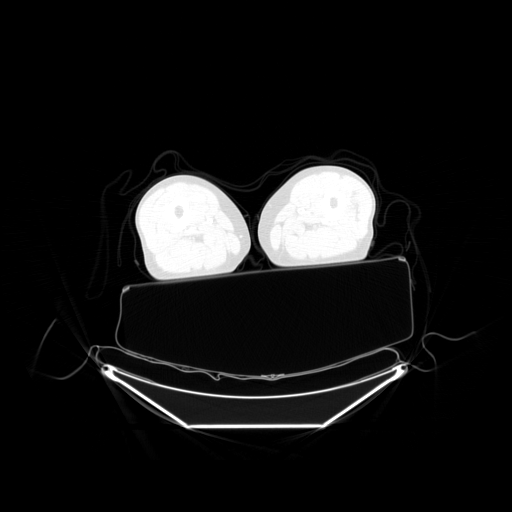
[im 75/297]
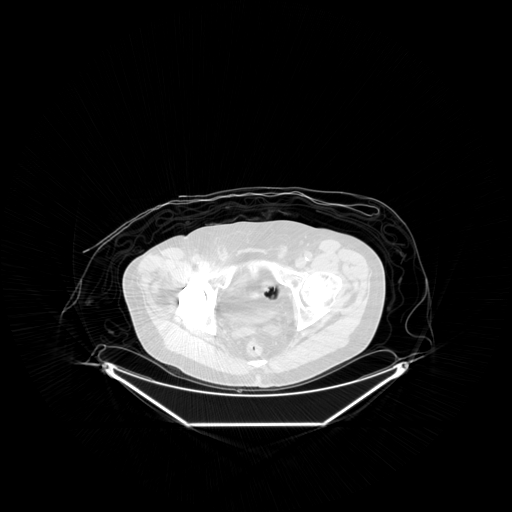
[im 149/297]
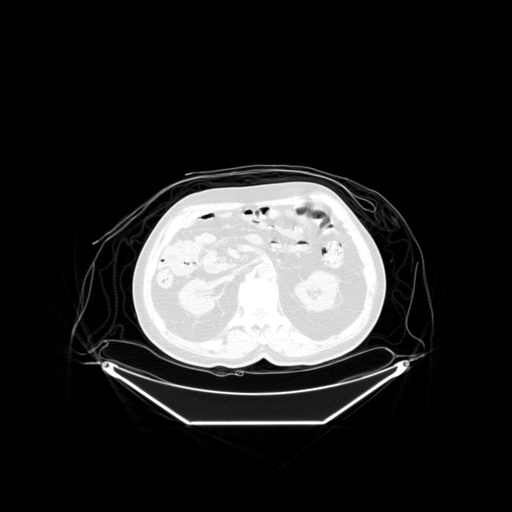
[im 223/297]
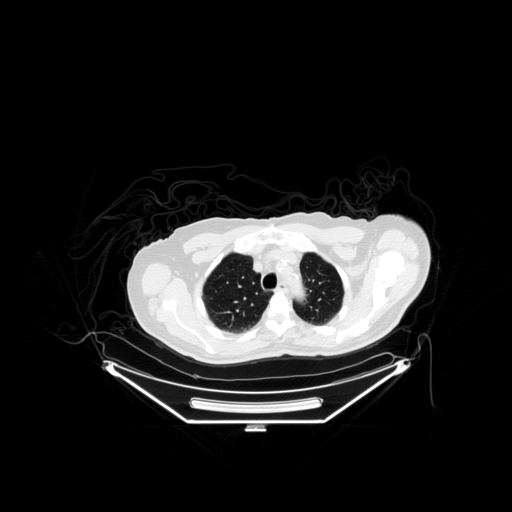
[im 297/297]
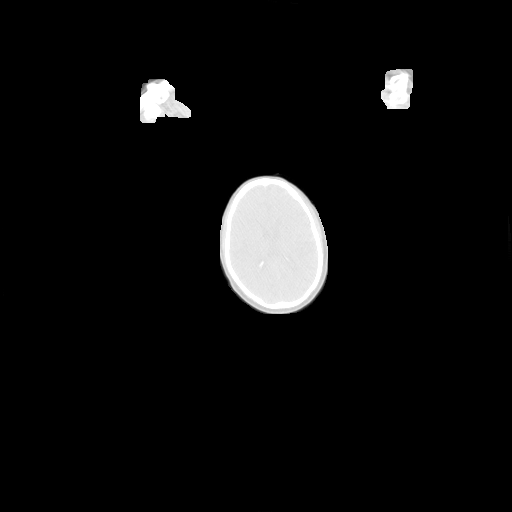

[Series 3: pet ac 3d body · axial · 3.3mm · 5.47mm/px · z∈[-978,-3]mm · 5 of 299 slices shown]
[im 1/299]
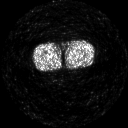
[im 75/299]
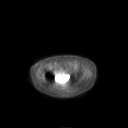
[im 150/299]
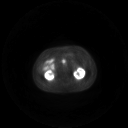
[im 224/299]
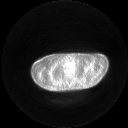
[im 299/299]
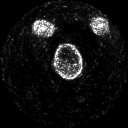

[Series 4: pet nac 3d body · axial · 3.3mm · 5.47mm/px · z∈[-978,-3]mm · 5 of 299 slices shown]
[im 1/299]
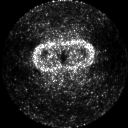
[im 75/299]
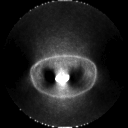
[im 150/299]
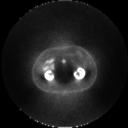
[im 224/299]
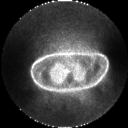
[im 299/299]
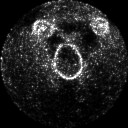

[Series 303: pet axial · axial · 3.3mm · 5.47mm/px · z∈[-978,-3]mm · 5 of 299 slices shown]
[im 1/299]
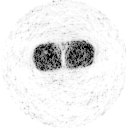
[im 75/299]
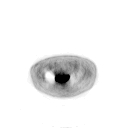
[im 150/299]
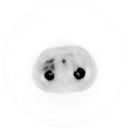
[im 224/299]
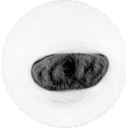
[im 299/299]
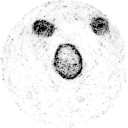

[Series 304: pet sagittal · sagittal · 5.5mm · 7.82mm/px · 2 of 91 slices shown]
[im 1/91]
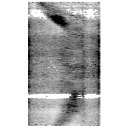
[im 91/91]
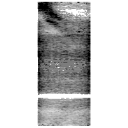

[Series 305: pet coronal · coronal · 5.5mm · 7.82mm/px · 2 of 93 slices shown]
[im 1/93]
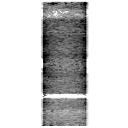
[im 93/93]
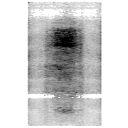

[Series 1105: results mm oncology reading · 4.0mm · 0.45mm/px · 1 of 2 slices shown]
[im 1/2]
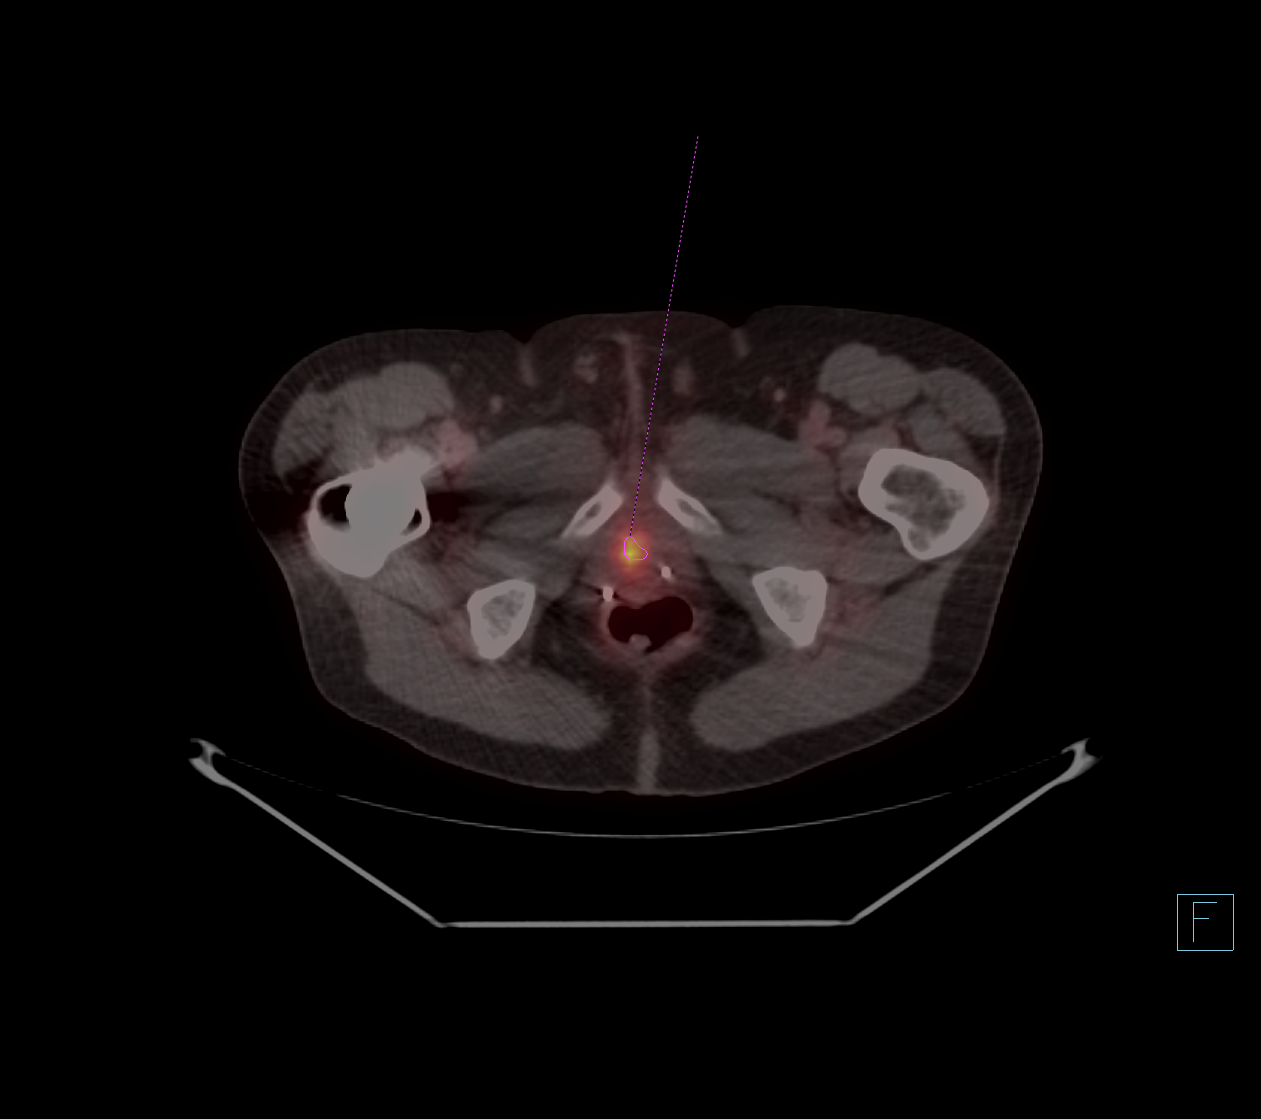

[25 of 25 positions shown; findings below may reference images not displayed]

FINDINGS: NECK

No radiotracer activity in neck lymph nodes.

Incidental CT finding: None

CHEST

No radiotracer accumulation within mediastinal or hilar lymph nodes.
No suspicious pulmonary nodules on the CT scan.

Incidental CT finding: None

ABDOMEN/PELVIS

Prostate: focus radiotracer activity just RIGHT of midline towards
the apex of the prostate gland centrally with SUV max equal 12.6. On
sagittal projection, this activity appears to be anterior to the
expected course of the urethra.

Lymph nodes: No abnormal radiotracer accumulation within pelvic or
abdominal nodes.

Liver: No evidence of liver metastasis

Incidental CT finding: None

SKELETON

No focal  activity to suggest skeletal metastasis.
IMPRESSION: 1. Focal activity the apical region of the prostate gland as
described above. Findings could represent local recurrence.
2. No evidence metastatic adenopathy in the pelvis or periaortic
retroperitoneum.
3. No evidence of visceral metastasis or skeletal metastasis.

## 2022-09-02 ENCOUNTER — Ambulatory Visit: Payer: 59 | Admitting: Urology

## 2022-09-02 NOTE — Telephone Encounter (Signed)
Left message for pt to call back to reschedule his missed appointment from today.

## 2022-09-08 ENCOUNTER — Encounter: Payer: Self-pay | Admitting: Urology

## 2022-09-08 ENCOUNTER — Other Ambulatory Visit: Payer: Self-pay | Admitting: Family Medicine

## 2022-09-08 ENCOUNTER — Ambulatory Visit (INDEPENDENT_AMBULATORY_CARE_PROVIDER_SITE_OTHER): Payer: 59 | Admitting: Urology

## 2022-09-08 VITALS — BP 112/62 | HR 74 | Ht 66.0 in | Wt 163.0 lb

## 2022-09-08 DIAGNOSIS — N3941 Urge incontinence: Secondary | ICD-10-CM

## 2022-09-08 DIAGNOSIS — Z8546 Personal history of malignant neoplasm of prostate: Secondary | ICD-10-CM | POA: Diagnosis not present

## 2022-09-08 DIAGNOSIS — C61 Malignant neoplasm of prostate: Secondary | ICD-10-CM

## 2022-09-08 LAB — BLADDER SCAN AMB NON-IMAGING: Scan Result: 0

## 2022-09-08 MED ORDER — GEMTESA 75 MG PO TABS: 1.0000 | ORAL_TABLET | Freq: Every day | ORAL | 2 refills | Status: DC

## 2022-09-08 NOTE — Progress Notes (Signed)
I, [name], acting as a scribe for Justin Scotland, MD.,have documented all relevant documentation on the behalf of Justin Scotland, MD,as directed by  Justin Scotland, MD while in the presence of Justin Scotland, MD.  09/08/2022 9:26 AM   [name] Nov 29, 1934 [id number]  Referring provider: Myrene Buddy, NP [address] [place],  [place] [address]  Chief Complaint  Patient presents with   Follow-up    HPI: 88 year-old male with a personal history of prostate cancer and urge incontinence presents today for a routine annual follow-up.  He was diagnosed with clinical stage T1c, Gleason 3+4 adenocarcinoma of the prostate in 12/2005 and he completed IMRT in 2008.    Over the recent past, his PSA has been slowly trending upwards but over the past several years seems to have stabilized.    His most recent PSA was 7 on 08/26/2022.   He underwent a PSMA PET scan on 09/07/2021 that visualized focal  activity the apical region of the prostate gland. Findings could represent local recurrence. No evidence metastatic adenopathy in the pelvis or periaortic retroperitoneum.No evidence of visceral metastasis or skeletal metastasis.  PSA trend: 0.7   2014 0.9   10/2013 1.4   10/2014 1.5   10/2015 1.8   10/2016 2.1   10/2017 2.6   10/2018 2.46  06/27/2019 3.64  08/21/2020 4.52  08/19/2021 7.00  08/26/2022  He reports a personal history of back pain. He takes [place] which is still working well. He does wear pads as a precaution. He forgets things sometimes, but to be expected with age.    Results for orders placed or performed in visit on 09/08/22  Bladder Scan (Post Void Residual) in office  Result Value Ref Range   Scan Result 0      PMH: Past Medical History:  Diagnosis Date   Acid reflux    Diabetes mellitus without complication (HCC)    Gout    Heart disease    Heart disease    Hyperlipidemia    Hypertension    Hypertension    Prostate cancer [facility])      Surgical History: Past Surgical History:  Procedure Laterality Date   CARDIAC SURGERY     JOINT REPLACEMENT      Home Medications:  Allergies as of 09/08/2022       Reactions   Sulfa Antibiotics Rash        Medication List        Accurate as of Sep 08, 2022  9:26 AM. If you have any questions, ask your nurse or doctor.          STOP taking these medications    traZODone 50 MG tablet Commonly known as: DESYREL Stopped by: Justin Scotland, MD   triamcinolone cream 0.5 % Commonly known as: KENALOG Stopped by: Justin Scotland, MD       TAKE these medications    amLODipine 5 MG tablet Commonly known as: NORVASC Take 5 mg by mouth daily.   aspirin EC 81 MG tablet Take 81 mg by mouth daily.   atenolol 25 MG tablet Commonly known as: TENORMIN Take 25 mg by mouth daily.   clopidogrel 75 MG tablet Commonly known as: PLAVIX Take 75 mg by mouth daily.   diclofenac Sodium 1 % Gel Commonly known as: VOLTAREN Apply 2 g topically 4 (four) times daily.   gabapentin 300 MG capsule Commonly known as: NEURONTIN Take 300 mg by mouth daily.   Gemtesa 75  MG Tabs Generic drug: Vibegron Take 1 tablet (75 mg total) by mouth daily.   glimepiride 4 MG tablet Commonly known as: AMARYL Take 4 mg by mouth 2 (two) times daily.   lovastatin 20 MG tablet Commonly known as: MEVACOR TAKE 1 TABLET BY MOUTH DAILY  WITH DINNER   metFORMIN 500 MG tablet Commonly known as: GLUCOPHAGE 500 mg 2 (two) times daily with a meal.   pantoprazole 40 MG tablet Commonly known as: PROTONIX Take 40 mg by mouth daily.   traMADol 50 MG tablet Commonly known as: ULTRAM Take 75 mg by mouth every 12 (twelve) hours as needed.        Allergies:  Allergies  Allergen Reactions   Sulfa Antibiotics Rash    Family History: Family History  Problem Relation Age of Onset   Kidney failure Sister    Prostate cancer Father    Ulcers Father    Diabetes Sister    Ulcers Sister      Social History:  reports that he has never smoked. He has never used smokeless tobacco. He reports that he does not drink alcohol and does not use drugs.   Physical Exam: BP 112/62   Pulse 74   Ht 5\' 6"  (1.676 m)   Wt 163 lb (73.9 kg)   BMI 26.31 kg/m   Constitutional:  Alert and oriented, No acute distress. HEENT: [place] AT, moist mucus membranes.  Trachea midline, no masses. Neurologic: Grossly intact, no focal deficits, moving all 4 extremities. Psychiatric: Normal mood and affect.   Assessment & Plan:    Prostate cancer  - Non-metastatic localized recurrence as of last PET scan. Been managed with watchful waiting. His PSA doubling time is over a year.  -Remains asymptomatic, suspect his disease is likely still organ-confined or low-volume local regional  - Recommend continue watchful waiting due to age and other factors. If PSA is above 10 next year may consider repeating a PET scan for surveillance -Will consider starting palliative ADT if he becomes symptomatic or his PSA begins to rise rapidly  2. Urge incontinence  - Manages well on Gemtesa. Wears a pad just as a precaution. Refill medication.  Return in about 1 year (around 09/08/2023) for PSA.  I have reviewed the above documentation for accuracy and completeness, and I agree with the above.   Justin Scotland, MD   [facility] [address], [address] [place], [place] [address] [phone]

## 2022-09-09 ENCOUNTER — Ambulatory Visit: Payer: Medicare Other | Admitting: Urology

## 2022-11-08 ENCOUNTER — Other Ambulatory Visit: Payer: Self-pay | Admitting: Orthopedic Surgery

## 2022-11-08 DIAGNOSIS — S46002A Unspecified injury of muscle(s) and tendon(s) of the rotator cuff of left shoulder, initial encounter: Secondary | ICD-10-CM

## 2022-11-08 DIAGNOSIS — M25312 Other instability, left shoulder: Secondary | ICD-10-CM

## 2022-11-12 ENCOUNTER — Ambulatory Visit
Admission: RE | Admit: 2022-11-12 | Discharge: 2022-11-12 | Disposition: A | Discharge status: Home / Self Care | Payer: 59 | Source: Ambulatory Visit | Attending: Orthopedic Surgery | Admitting: Orthopedic Surgery

## 2022-11-12 DIAGNOSIS — M25312 Other instability, left shoulder: Secondary | ICD-10-CM | POA: Insufficient documentation

## 2022-11-12 DIAGNOSIS — S46002A Unspecified injury of muscle(s) and tendon(s) of the rotator cuff of left shoulder, initial encounter: Secondary | ICD-10-CM | POA: Diagnosis not present

## 2023-01-09 ENCOUNTER — Ambulatory Visit: Payer: 59

## 2023-01-09 DIAGNOSIS — Z719 Counseling, unspecified: Secondary | ICD-10-CM

## 2023-01-09 DIAGNOSIS — Z23 Encounter for immunization: Secondary | ICD-10-CM | POA: Diagnosis not present

## 2023-01-09 NOTE — Progress Notes (Signed)
Patient seen in nurse clinic for COVID vaccine. Comirnaty Fall 2024-25 given IM left deltoid.  Tolerated well. VIS provided. NCIR updated and copy provided.

## 2023-02-26 ENCOUNTER — Other Ambulatory Visit: Payer: Self-pay | Admitting: Urology

## 2023-05-30 ENCOUNTER — Ambulatory Visit (INDEPENDENT_AMBULATORY_CARE_PROVIDER_SITE_OTHER): Payer: 59 | Admitting: Physician Assistant

## 2023-05-30 VITALS — BP 93/61 | HR 72 | Ht 67.0 in | Wt 163.0 lb

## 2023-05-30 DIAGNOSIS — R300 Dysuria: Secondary | ICD-10-CM

## 2023-05-30 LAB — URINALYSIS, COMPLETE
Bilirubin, UA: NEGATIVE
Nitrite, UA: POSITIVE — AB
Specific Gravity, UA: 1.025 (ref 1.005–1.030)
Urobilinogen, Ur: 1 mg/dL (ref 0.2–1.0)
pH, UA: 5.5 (ref 5.0–7.5)

## 2023-05-30 LAB — MICROSCOPIC EXAMINATION: WBC, UA: >30 /[HPF] — AB (ref 0–5)

## 2023-05-30 LAB — BLADDER SCAN AMB NON-IMAGING: Scan Result: 0

## 2023-05-30 MED ORDER — CEFUROXIME AXETIL 250 MG PO TABS: 250.0000 mg | ORAL_TABLET | Freq: Two times a day (BID) | ORAL | 0 refills | Status: AC

## 2023-05-30 NOTE — Progress Notes (Signed)
 05/30/2023 1:43 PM   Justin Greene [birth year] [id number]  CC: Chief Complaint  Patient presents with   Dysuria   HPI: Justin Greene is a 89 y.o. male with PMH diabetes, prostate cancer s/p IMRT in 2008 with local recurrence on active surveillance, and urge incontinence on Gemtesa  who presents today for evaluation of possible UTI.   Today he reports 6 days of dysuria and malodorous urine.  He denies fever, chills, nausea, vomiting, or flank pain.  He describes chronic low back pain, which is stable.  In-office UA today positive for 1+ glucose, trace ketones, trace intact blood, 1+ protein, nitrites, and 2+ leukocytes; urine microscopy with >30 WBCs/HPF, 3-10 RBCs/HPF, and many bacteria. PVR 0mL.  PMH: Past Medical History:  Diagnosis Date   Acid reflux    Diabetes mellitus without complication (HCC)    Gout    Heart disease    Heart disease    Hyperlipidemia    Hypertension    Hypertension    Prostate cancer [facility])     Surgical History: Past Surgical History:  Procedure Laterality Date   CARDIAC SURGERY     JOINT REPLACEMENT      Home Medications:  Allergies as of 05/30/2023       Reactions   Sulfa Antibiotics Rash        Medication List        Accurate as of May 30, 2023  1:43 PM. If you have any questions, ask your nurse or doctor.          amLODipine 5 MG tablet Commonly known as: NORVASC Take 5 mg by mouth daily.   aspirin  EC 81 MG tablet Take 81 mg by mouth daily.   atenolol  25 MG tablet Commonly known as: TENORMIN  Take 25 mg by mouth daily.   clopidogrel  75 MG tablet Commonly known as: PLAVIX  Take 75 mg by mouth daily.   gabapentin  300 MG capsule Commonly known as: NEURONTIN  Take 300 mg by mouth daily.   Gemtesa  75 MG Tabs Generic drug: Vibegron  TAKE 1 TABLET BY MOUTH DAILY   glimepiride 4 MG tablet Commonly known as: AMARYL Take 4 mg by mouth 2 (two) times daily.   lovastatin 20 MG tablet Commonly known as:  MEVACOR TAKE 1 TABLET BY MOUTH DAILY  WITH DINNER   metFORMIN 500 MG tablet Commonly known as: GLUCOPHAGE 500 mg 2 (two) times daily with a meal.   pantoprazole 40 MG tablet Commonly known as: PROTONIX Take 40 mg by mouth daily.   traMADol  50 MG tablet Commonly known as: ULTRAM  Take 75 mg by mouth every 12 (twelve) hours as needed.        Allergies:  Allergies  Allergen Reactions   Sulfa Antibiotics Rash    Family History: Family History  Problem Relation Age of Onset   Kidney failure Sister    Prostate cancer Father    Ulcers Father    Diabetes Sister    Ulcers Sister     Social History:   reports that he has never smoked. He has never used smokeless tobacco. He reports that he does not drink alcohol and does not use drugs.  Physical Exam: BP 93/61   Pulse 72   Ht 5' 7 (1.702 m)   Wt 163 lb (73.9 kg)   BMI 25.53 kg/m   Constitutional:  Alert and oriented, no acute distress, nontoxic appearing HEENT: [place], AT Cardiovascular: No clubbing, cyanosis, or edema Respiratory: Normal respiratory effort, no increased work of breathing  Skin: No rashes, bruises or suspicious lesions Neurologic: Grossly intact, no focal deficits, moving all 4 extremities Psychiatric: Normal mood and affect  Laboratory Data: Results for orders placed or performed in visit on 05/30/23  Microscopic Examination   Collection Time: 05/30/23  1:14 PM   Urine  Result Value Ref Range   WBC, UA >30 (A) 0 - 5 /hpf   RBC, Urine 3-10 (A) 0 - 2 /hpf   Epithelial Cells (non renal) 0-10 0 - 10 /hpf   Casts Present (A) None seen /lpf   Cast Type Granular casts (A) N/A   Bacteria, UA Many (A) None seen/Few  Urinalysis, Complete   Collection Time: 05/30/23  1:14 PM  Result Value Ref Range   Specific Gravity, UA 1.025 1.005 - 1.030   pH, UA 5.5 5.0 - 7.5   Color, UA Yellow Yellow   Appearance Ur Hazy (A) Clear   Leukocytes,UA 2+ (A) Negative   Protein,UA 1+ (A) Negative/Trace   Glucose, UA 1+  (A) Negative   Ketones, UA Trace (A) Negative   RBC, UA Trace (A) Negative   Bilirubin, UA Negative Negative   Urobilinogen, Ur 1.0 0.2 - 1.0 mg/dL   Nitrite, UA Positive (A) Negative   Microscopic Examination See below:   Bladder Scan (Post Void Residual) in office   Collection Time: 05/30/23  1:24 PM  Result Value Ref Range   Scan Result 0    Assessment & Plan:   1. Dysuria (Primary) UA appears grossly infected, will start empiric cefuroxime  and send for culture for further evaluation.  He is emptying appropriately.  Will plan for repeat UA in 1 week to prove resolution of microscopic hematuria after completion of culture appropriate antibiotics.  Low suspicion that his chronic low back pain is related to his prostate cancer given reports that it is stable and evidence of spinal stenosis on prior lumbar MRI.  Regardless, agree with scheduled follow-up with Dr. Penne later this year. - Urinalysis, Complete - Bladder Scan (Post Void Residual) in office - CULTURE, URINE COMPREHENSIVE - cefUROXime  (CEFTIN ) 250 MG tablet; Take 1 tablet (250 mg total) by mouth 2 (two) times daily with a meal for 7 days.  Dispense: 14 tablet; Refill: 0   Return in about 1 week (around 06/06/2023) for Lab visit for UA.  Lucie Hones, PA-C  [facility]  [address] [place], [place] [address] [phone]

## 2023-06-02 LAB — CULTURE, URINE COMPREHENSIVE

## 2023-06-08 ENCOUNTER — Other Ambulatory Visit: Payer: Self-pay

## 2023-06-08 ENCOUNTER — Other Ambulatory Visit: Payer: 59

## 2023-06-08 DIAGNOSIS — R300 Dysuria: Secondary | ICD-10-CM

## 2023-06-08 LAB — URINALYSIS, COMPLETE
Bilirubin, UA: NEGATIVE
Ketones, UA: NEGATIVE
Leukocytes,UA: NEGATIVE
Nitrite, UA: NEGATIVE
RBC, UA: NEGATIVE
Specific Gravity, UA: 1.025 (ref 1.005–1.030)
Urobilinogen, Ur: 0.2 mg/dL (ref 0.2–1.0)
pH, UA: 5.5 (ref 5.0–7.5)

## 2023-06-08 LAB — MICROSCOPIC EXAMINATION

## 2023-08-04 ENCOUNTER — Other Ambulatory Visit: Payer: Self-pay | Admitting: Otolaryngology

## 2023-08-04 DIAGNOSIS — H90A22 Sensorineural hearing loss, unilateral, left ear, with restricted hearing on the contralateral side: Secondary | ICD-10-CM

## 2023-08-24 ENCOUNTER — Ambulatory Visit
Admission: RE | Admit: 2023-08-24 | Discharge: 2023-08-24 | Disposition: A | Discharge status: Home / Self Care | Source: Ambulatory Visit | Attending: Otolaryngology | Admitting: Otolaryngology

## 2023-08-24 DIAGNOSIS — H90A22 Sensorineural hearing loss, unilateral, left ear, with restricted hearing on the contralateral side: Secondary | ICD-10-CM

## 2023-08-24 MED ORDER — GADOPICLENOL 0.5 MMOL/ML IV SOLN
7.5000 mL | Freq: Once | INTRAVENOUS | Status: AC | PRN
  Administered 2023-08-24: 7.5 mL via INTRAVENOUS

## 2023-09-08 ENCOUNTER — Ambulatory Visit: Payer: 59 | Admitting: Urology

## 2023-09-22 ENCOUNTER — Ambulatory Visit (INDEPENDENT_AMBULATORY_CARE_PROVIDER_SITE_OTHER): Payer: Self-pay | Admitting: Urology

## 2023-09-22 ENCOUNTER — Other Ambulatory Visit
Admission: RE | Admit: 2023-09-22 | Discharge: 2023-09-22 | Disposition: A | Discharge status: Home / Self Care | Attending: Urology | Admitting: Urology

## 2023-09-22 ENCOUNTER — Other Ambulatory Visit: Payer: Self-pay

## 2023-09-22 ENCOUNTER — Ambulatory Visit: Payer: Self-pay | Admitting: Urology

## 2023-09-22 VITALS — BP 128/72 | HR 76 | Ht 67.0 in | Wt 166.2 lb

## 2023-09-22 DIAGNOSIS — C61 Malignant neoplasm of prostate: Secondary | ICD-10-CM

## 2023-09-22 DIAGNOSIS — N3941 Urge incontinence: Secondary | ICD-10-CM

## 2023-09-22 DIAGNOSIS — Z8546 Personal history of malignant neoplasm of prostate: Secondary | ICD-10-CM | POA: Diagnosis present

## 2023-09-22 LAB — PSA: Prostatic Specific Antigen: 6.74 ng/mL — ABNORMAL HIGH (ref 0.00–4.00)

## 2023-09-22 LAB — BLADDER SCAN AMB NON-IMAGING

## 2023-09-22 NOTE — Progress Notes (Signed)
 Justin Greene,acting as a scribe for Justin Gimenez, MD.,have documented all relevant documentation on the behalf of Justin Gimenez, MD,as directed by  Justin Gimenez, MD while in the presence of Justin Gimenez, MD.  09/22/23 12:57 PM   Justin Greene 1935-04-02 [id number]  Referring provider: Deliah Fells, NP [address] [place],  [place] [address]  Chief Complaint  Patient presents with   Prostate Cancer   Urinary Incontinence    HPI: 89 year old male with a personal history of prostate cancer and urge incontinence presents today for a routine annual follow-up.   He was diagnosed with clinical stage T1c, Gleason 3+4 adenocarcinoma of the prostate in 12/2005 and he completed IMRT in 2008.    Over the recent past, his PSA has been slowly trending upwards but over the past several years seems to have stabilized.   He reports a recent episode of fainting after bending down to pick up medication, which resulted in a two-day hospital stay at [facility] where he was diagnosed with dehydration. He has been advised to increase fluid intake.   He has been monitoring his PSA levels, which have been increasing, and is awaiting today's results.   He reports no significant changes in urination over the past year and continues to use a blue pad for incontinence. He is currently taking Gemtesa  for urinary symptoms, which has improved his condition with fewer accidents.  He experiences back pain localized to the sacrum and bilateral leg aches, which he attributes to nerve damage in his feet.   He mentions a change in doctors and a subsequent adjustment in his medication for stomach issues, which has improved.   PSA trend: 0.7   2014 0.9   10/2013 1.4   10/2014 1.5   10/2015 1.8   10/2016 2.1   10/2017 2.6   10/2018 2.46  06/27/2019 3.64  08/21/2020 4.52  08/19/2021 7.00  08/26/2022  Results for orders placed or performed in visit on 09/22/23  Bladder Scan (Post Void Residual) in  office  Result Value Ref Range   Scan Result 22mL     IPSS     Row Name 09/22/23 0900         International Prostate Symptom Score   How often have you had the sensation of not emptying your bladder? Not at All     How often have you had to urinate less than every two hours? About half the time     How often have you found you stopped and started again several times when you urinated? About half the time     How often have you found it difficult to postpone urination? Not at All     How often have you had a weak urinary stream? More than half the time     How often have you had to strain to start urination? Not at All     How many times did you typically get up at night to urinate? 2 Times     Total IPSS Score 12       Quality of Life due to urinary symptoms   If you were to spend the rest of your life with your urinary condition just the way it is now how would you feel about that? Mostly Satisfied              Score:  1-7 Mild 8-19 Moderate 20-35 Severe  PMH: Past Medical History:  Diagnosis Date   Acid  reflux    Diabetes mellitus without complication (HCC)    Gout    Heart disease    Heart disease    Hyperlipidemia    Hypertension    Hypertension    Prostate cancer [facility])     Surgical History: Past Surgical History:  Procedure Laterality Date   CARDIAC SURGERY     JOINT REPLACEMENT      Home Medications:  Allergies as of 09/22/2023       Reactions   Sulfa Antibiotics Rash        Medication List        Accurate as of Sep 22, 2023 12:57 PM. If you have any questions, ask your nurse or doctor.          Accu-Chek Guide Test test strip Generic drug: glucose blood 1 strip.   amLODipine 5 MG tablet Commonly known as: NORVASC Take 5 mg by mouth daily.   aspirin  EC 81 MG tablet Take 81 mg by mouth daily.   atenolol  25 MG tablet Commonly known as: TENORMIN  Take 25 mg by mouth daily.   celecoxib 200 MG capsule Commonly known as:  CELEBREX Take 200 mg by mouth daily.   clopidogrel  75 MG tablet Commonly known as: PLAVIX  Take 75 mg by mouth daily.   gabapentin  300 MG capsule Commonly known as: NEURONTIN  Take 300 mg by mouth daily.   Gemtesa  75 MG Tabs Generic drug: Vibegron  TAKE 1 TABLET BY MOUTH DAILY   glimepiride 4 MG tablet Commonly known as: AMARYL Take 4 mg by mouth 2 (two) times daily.   lovastatin 20 MG tablet Commonly known as: MEVACOR TAKE 1 TABLET BY MOUTH DAILY  WITH DINNER   metFORMIN 500 MG tablet Commonly known as: GLUCOPHAGE 500 mg 2 (two) times daily with a meal.   pantoprazole 40 MG tablet Commonly known as: PROTONIX Take 40 mg by mouth daily.   traMADol  50 MG tablet Commonly known as: ULTRAM  Take 75 mg by mouth every 12 (twelve) hours as needed.   triamcinolone  cream 0.5 % Commonly known as: KENALOG  Apply 1 Application topically 2 (two) times daily.        Allergies:  Allergies  Allergen Reactions   Sulfa Antibiotics Rash    Family History: Family History  Problem Relation Age of Onset   Kidney failure Sister    Prostate cancer Father    Ulcers Father    Diabetes Sister    Ulcers Sister     Social History:  reports that he has never smoked. He has never used smokeless tobacco. He reports that he does not drink alcohol and does not use drugs.   Physical Exam: BP 128/72 (BP Location: Left Arm, Patient Position: Sitting, Cuff Size: Normal)   Pulse 76   Ht 5\' 7"  (1.702 m)   Wt 166 lb 3.2 oz (75.4 kg)   BMI 26.03 kg/m   Constitutional:  Alert and oriented, No acute distress. HEENT: [place] AT, moist mucus membranes.  Trachea midline, no masses. Neurologic: Grossly intact, no focal deficits, moving all 4 extremities. Psychiatric: Normal mood and affect.   Assessment & Plan:    1. Prostate Cancer - He has a history of prostate cancer with rising PSA levels.  - The most recent PSA test was conducted today, and results are pending. If the PSA level exceeds 10, a  scan will be considered to assess the extent of the disease.  - He will be contacted with further instructions based on the PSA results.  -  Prostate cancer is typically slow-growing, but monitoring is essential to prevent metastasis, particularly to bones in order to avoid pain/pathologic fractures.  Currently asymptomatic.  2. Urinary Incontinence - He is currently on Gemtesa , which has improved symptoms of urinary incontinence. The medication will be continued as it is effective, and no dose adjustment is possible.  - No additional medications will be added to avoid contributing to dizziness or fainting episodes.   Return for review of PSA results. Further follow up will be based on PSA levels and any new symptoms.  I have reviewed the above documentation for accuracy and completeness, and I agree with the above.   Justin Gimenez, MD   [facility] [address] [place], [place] [address] [phone]

## 2024-03-14 ENCOUNTER — Other Ambulatory Visit: Payer: Self-pay

## 2024-03-14 MED ORDER — GEMTESA 75 MG PO TABS: 1.0000 | ORAL_TABLET | Freq: Every day | ORAL | 1 refills | Status: AC

## 2024-09-10 ENCOUNTER — Other Ambulatory Visit

## 2024-09-17 ENCOUNTER — Ambulatory Visit: Admitting: Urology
